# Patient Record
Sex: Female | Born: 1950 | Race: White | Hispanic: No | Smoking: Current every day smoker
Health system: Southern US, Community
[De-identification: ages and names within clinical notes are randomized; demographics above are authoritative.]

## PROBLEM LIST (undated history)

## (undated) DIAGNOSIS — C3490 Malignant neoplasm of unspecified part of unspecified bronchus or lung: Secondary | ICD-10-CM

## (undated) DIAGNOSIS — C801 Malignant (primary) neoplasm, unspecified: Secondary | ICD-10-CM

## (undated) DIAGNOSIS — I10 Essential (primary) hypertension: Secondary | ICD-10-CM

## (undated) HISTORY — PX: BRAIN SURGERY: SHX531

## (undated) HISTORY — PX: OTHER SURGICAL HISTORY: SHX169

## (undated) HISTORY — PX: VEIN SURGERY: SHX48

## (undated) HISTORY — PX: ABDOMINAL HYSTERECTOMY: SHX81

---

## 2016-11-06 ENCOUNTER — Other Ambulatory Visit (HOSPITAL_BASED_OUTPATIENT_CLINIC_OR_DEPARTMENT_OTHER): Payer: Self-pay | Admitting: Radiation Oncology

## 2016-11-06 DIAGNOSIS — G9389 Other specified disorders of brain: Secondary | ICD-10-CM

## 2016-11-06 DIAGNOSIS — C342 Malignant neoplasm of middle lobe, bronchus or lung: Secondary | ICD-10-CM

## 2016-11-11 ENCOUNTER — Ambulatory Visit (HOSPITAL_BASED_OUTPATIENT_CLINIC_OR_DEPARTMENT_OTHER)
Admission: RE | Admit: 2016-11-11 | Discharge: 2016-11-11 | Disposition: A | Discharge status: Home / Self Care | Payer: Medicare PPO | Source: Ambulatory Visit | Attending: Radiation Oncology | Admitting: Radiation Oncology

## 2016-11-11 DIAGNOSIS — G936 Cerebral edema: Secondary | ICD-10-CM | POA: Insufficient documentation

## 2016-11-11 DIAGNOSIS — G9389 Other specified disorders of brain: Secondary | ICD-10-CM

## 2016-11-11 DIAGNOSIS — C342 Malignant neoplasm of middle lobe, bronchus or lung: Secondary | ICD-10-CM | POA: Diagnosis present

## 2016-11-11 DIAGNOSIS — J439 Emphysema, unspecified: Secondary | ICD-10-CM | POA: Insufficient documentation

## 2016-11-11 DIAGNOSIS — G939 Disorder of brain, unspecified: Secondary | ICD-10-CM | POA: Diagnosis present

## 2016-11-11 DIAGNOSIS — G930 Cerebral cysts: Secondary | ICD-10-CM | POA: Insufficient documentation

## 2016-11-11 DIAGNOSIS — R918 Other nonspecific abnormal finding of lung field: Secondary | ICD-10-CM | POA: Diagnosis not present

## 2016-11-11 DIAGNOSIS — Z9889 Other specified postprocedural states: Secondary | ICD-10-CM | POA: Diagnosis not present

## 2016-11-11 DIAGNOSIS — C3491 Malignant neoplasm of unspecified part of right bronchus or lung: Secondary | ICD-10-CM | POA: Insufficient documentation

## 2018-08-26 ENCOUNTER — Other Ambulatory Visit (HOSPITAL_BASED_OUTPATIENT_CLINIC_OR_DEPARTMENT_OTHER): Payer: Self-pay | Admitting: Radiation Oncology

## 2018-08-26 DIAGNOSIS — C342 Malignant neoplasm of middle lobe, bronchus or lung: Secondary | ICD-10-CM

## 2018-08-31 ENCOUNTER — Ambulatory Visit (HOSPITAL_BASED_OUTPATIENT_CLINIC_OR_DEPARTMENT_OTHER): Payer: Medicare PPO

## 2018-09-07 ENCOUNTER — Ambulatory Visit (HOSPITAL_BASED_OUTPATIENT_CLINIC_OR_DEPARTMENT_OTHER)
Admission: RE | Admit: 2018-09-07 | Discharge: 2018-09-07 | Disposition: A | Discharge status: Home / Self Care | Payer: Medicare PPO | Source: Ambulatory Visit | Attending: Radiation Oncology | Admitting: Radiation Oncology

## 2018-09-07 DIAGNOSIS — C7931 Secondary malignant neoplasm of brain: Secondary | ICD-10-CM | POA: Insufficient documentation

## 2018-09-07 DIAGNOSIS — C342 Malignant neoplasm of middle lobe, bronchus or lung: Secondary | ICD-10-CM

## 2018-09-07 MED ORDER — GADOBENATE DIMEGLUMINE 529 MG/ML IV SOLN
10.0000 mL | Freq: Once | INTRAVENOUS | Status: AC | PRN
  Administered 2018-09-07: 10 mL via INTRAVENOUS

## 2018-10-05 ENCOUNTER — Inpatient Hospital Stay (HOSPITAL_COMMUNITY): Payer: Medicare PPO

## 2018-10-05 ENCOUNTER — Other Ambulatory Visit: Payer: Self-pay

## 2018-10-05 ENCOUNTER — Emergency Department (HOSPITAL_COMMUNITY): Payer: Medicare PPO

## 2018-10-05 ENCOUNTER — Encounter (HOSPITAL_BASED_OUTPATIENT_CLINIC_OR_DEPARTMENT_OTHER): Payer: Self-pay | Admitting: *Deleted

## 2018-10-05 ENCOUNTER — Inpatient Hospital Stay (HOSPITAL_BASED_OUTPATIENT_CLINIC_OR_DEPARTMENT_OTHER)
Admission: EM | Admit: 2018-10-05 | Discharge: 2018-10-08 | DRG: 253 | Disposition: A | Discharge status: Home / Self Care | Payer: Medicare PPO | Source: Other Acute Inpatient Hospital | Attending: Internal Medicine | Admitting: Internal Medicine

## 2018-10-05 DIAGNOSIS — I7092 Chronic total occlusion of artery of the extremities: Secondary | ICD-10-CM | POA: Diagnosis present

## 2018-10-05 DIAGNOSIS — C7931 Secondary malignant neoplasm of brain: Secondary | ICD-10-CM | POA: Diagnosis present

## 2018-10-05 DIAGNOSIS — F17210 Nicotine dependence, cigarettes, uncomplicated: Secondary | ICD-10-CM | POA: Diagnosis present

## 2018-10-05 DIAGNOSIS — Z79899 Other long term (current) drug therapy: Secondary | ICD-10-CM

## 2018-10-05 DIAGNOSIS — Z7951 Long term (current) use of inhaled steroids: Secondary | ICD-10-CM | POA: Diagnosis not present

## 2018-10-05 DIAGNOSIS — T82898A Other specified complication of vascular prosthetic devices, implants and grafts, initial encounter: Principal | ICD-10-CM | POA: Diagnosis present

## 2018-10-05 DIAGNOSIS — R402360 Coma scale, best motor response, obeys commands, unspecified time: Secondary | ICD-10-CM | POA: Diagnosis present

## 2018-10-05 DIAGNOSIS — R402140 Coma scale, eyes open, spontaneous, unspecified time: Secondary | ICD-10-CM | POA: Diagnosis present

## 2018-10-05 DIAGNOSIS — R200 Anesthesia of skin: Secondary | ICD-10-CM | POA: Diagnosis present

## 2018-10-05 DIAGNOSIS — I998 Other disorder of circulatory system: Secondary | ICD-10-CM | POA: Diagnosis present

## 2018-10-05 DIAGNOSIS — R402250 Coma scale, best verbal response, oriented, unspecified time: Secondary | ICD-10-CM | POA: Diagnosis present

## 2018-10-05 DIAGNOSIS — I739 Peripheral vascular disease, unspecified: Secondary | ICD-10-CM | POA: Diagnosis not present

## 2018-10-05 DIAGNOSIS — G8918 Other acute postprocedural pain: Secondary | ICD-10-CM

## 2018-10-05 DIAGNOSIS — M79604 Pain in right leg: Secondary | ICD-10-CM | POA: Diagnosis present

## 2018-10-05 DIAGNOSIS — I10 Essential (primary) hypertension: Secondary | ICD-10-CM | POA: Diagnosis present

## 2018-10-05 DIAGNOSIS — J449 Chronic obstructive pulmonary disease, unspecified: Secondary | ICD-10-CM | POA: Diagnosis present

## 2018-10-05 DIAGNOSIS — C3490 Malignant neoplasm of unspecified part of unspecified bronchus or lung: Secondary | ICD-10-CM

## 2018-10-05 DIAGNOSIS — Y832 Surgical operation with anastomosis, bypass or graft as the cause of abnormal reaction of the patient, or of later complication, without mention of misadventure at the time of the procedure: Secondary | ICD-10-CM | POA: Diagnosis present

## 2018-10-05 DIAGNOSIS — T82868A Thrombosis of vascular prosthetic devices, implants and grafts, initial encounter: Secondary | ICD-10-CM | POA: Diagnosis not present

## 2018-10-05 DIAGNOSIS — Z01811 Encounter for preprocedural respiratory examination: Secondary | ICD-10-CM

## 2018-10-05 DIAGNOSIS — Z923 Personal history of irradiation: Secondary | ICD-10-CM | POA: Diagnosis not present

## 2018-10-05 DIAGNOSIS — Z7982 Long term (current) use of aspirin: Secondary | ICD-10-CM

## 2018-10-05 DIAGNOSIS — Z880 Allergy status to penicillin: Secondary | ICD-10-CM

## 2018-10-05 HISTORY — DX: Malignant neoplasm of unspecified part of unspecified bronchus or lung: C34.90

## 2018-10-05 HISTORY — DX: Malignant (primary) neoplasm, unspecified: C80.1

## 2018-10-05 HISTORY — DX: Essential (primary) hypertension: I10

## 2018-10-05 LAB — CBC WITH DIFFERENTIAL/PLATELET
Abs Immature Granulocytes: 0.02 10*3/uL (ref 0.00–0.07)
Basophils Absolute: 0.1 10*3/uL (ref 0.0–0.1)
Basophils Relative: 1 %
Eosinophils Absolute: 0.3 10*3/uL (ref 0.0–0.5)
Eosinophils Relative: 3 %
HCT: 44.5 % (ref 36.0–46.0)
Hemoglobin: 14 g/dL (ref 12.0–15.0)
Immature Granulocytes: 0 %
Lymphocytes Relative: 35 %
Lymphs Abs: 3.4 10*3/uL (ref 0.7–4.0)
MCH: 28.9 pg (ref 26.0–34.0)
MCHC: 31.5 g/dL (ref 30.0–36.0)
MCV: 91.8 fL (ref 80.0–100.0)
Monocytes Absolute: 0.5 10*3/uL (ref 0.1–1.0)
Monocytes Relative: 5 %
Neutro Abs: 5.5 10*3/uL (ref 1.7–7.7)
Neutrophils Relative %: 56 %
PLATELETS: 218 10*3/uL (ref 150–400)
RBC: 4.85 MIL/uL (ref 3.87–5.11)
RDW: 13.7 % (ref 11.5–15.5)
WBC: 9.8 10*3/uL (ref 4.0–10.5)
nRBC: 0 % (ref 0.0–0.2)

## 2018-10-05 LAB — I-STAT CHEM 8, ED
BUN: 14 mg/dL (ref 8–23)
CHLORIDE: 102 mmol/L (ref 98–111)
Calcium, Ion: 1.11 mmol/L — ABNORMAL LOW (ref 1.15–1.40)
Creatinine, Ser: 0.8 mg/dL (ref 0.44–1.00)
Glucose, Bld: 89 mg/dL (ref 70–99)
HCT: 44 % (ref 36.0–46.0)
Hemoglobin: 15 g/dL (ref 12.0–15.0)
Potassium: 3.7 mmol/L (ref 3.5–5.1)
Sodium: 138 mmol/L (ref 135–145)
TCO2: 26 mmol/L (ref 22–32)

## 2018-10-05 LAB — I-STAT CG4 LACTIC ACID, ED: Lactic Acid, Venous: 0.98 mmol/L (ref 0.5–1.9)

## 2018-10-05 MED ORDER — FLUTICASONE FUROATE-VILANTEROL 100-25 MCG/INH IN AEPB
1.0000 | INHALATION_SPRAY | Freq: Every day | RESPIRATORY_TRACT | Status: DC
  Administered 2018-10-07 – 2018-10-08 (×2): 1 via RESPIRATORY_TRACT
  Filled 2018-10-05: qty 28

## 2018-10-05 MED ORDER — LORAZEPAM 1 MG PO TABS
1.0000 mg | ORAL_TABLET | Freq: Every day | ORAL | Status: DC
  Administered 2018-10-06 – 2018-10-07 (×3): 1 mg via ORAL
  Filled 2018-10-05 (×3): qty 1

## 2018-10-05 MED ORDER — MORPHINE SULFATE (PF) 4 MG/ML IV SOLN
4.0000 mg | Freq: Once | INTRAVENOUS | Status: AC
  Administered 2018-10-05: 4 mg via INTRAVENOUS
  Filled 2018-10-05: qty 1

## 2018-10-05 MED ORDER — MORPHINE SULFATE (PF) 2 MG/ML IV SOLN
2.0000 mg | INTRAVENOUS | Status: DC | PRN
  Administered 2018-10-06 – 2018-10-08 (×6): 2 mg via INTRAVENOUS
  Filled 2018-10-05 (×6): qty 1

## 2018-10-05 MED ORDER — ACETAMINOPHEN 325 MG PO TABS: 650.0000 mg | ORAL_TABLET | Freq: Four times a day (QID) | ORAL | Status: DC | PRN

## 2018-10-05 MED ORDER — IOPAMIDOL (ISOVUE-370) INJECTION 76%
100.0000 mL | Freq: Once | INTRAVENOUS | Status: AC | PRN
  Administered 2018-10-05: 100 mL via INTRAVENOUS

## 2018-10-05 MED ORDER — POTASSIUM CHLORIDE IN NACL 20-0.9 MEQ/L-% IV SOLN
INTRAVENOUS | Status: AC
  Administered 2018-10-06: via INTRAVENOUS
  Filled 2018-10-05: qty 1000

## 2018-10-05 MED ORDER — BUPROPION HCL 100 MG PO TABS
100.0000 mg | ORAL_TABLET | Freq: Three times a day (TID) | ORAL | Status: DC
  Administered 2018-10-07 – 2018-10-08 (×3): 100 mg via ORAL
  Filled 2018-10-05 (×7): qty 1

## 2018-10-05 MED ORDER — ONDANSETRON HCL 4 MG/2ML IJ SOLN: 4.0000 mg | Freq: Four times a day (QID) | INTRAMUSCULAR | Status: DC | PRN

## 2018-10-05 MED ORDER — ONDANSETRON HCL 4 MG/2ML IJ SOLN
4.0000 mg | Freq: Once | INTRAMUSCULAR | Status: AC
  Administered 2018-10-05: 4 mg via INTRAVENOUS
  Filled 2018-10-05: qty 2

## 2018-10-05 MED ORDER — ONDANSETRON HCL 4 MG PO TABS: 4.0000 mg | ORAL_TABLET | Freq: Four times a day (QID) | ORAL | Status: DC | PRN

## 2018-10-05 MED ORDER — POLYETHYLENE GLYCOL 3350 17 G PO PACK
17.0000 g | PACK | Freq: Every day | ORAL | Status: DC | PRN
  Administered 2018-10-08: 17 g via ORAL
  Filled 2018-10-05: qty 1

## 2018-10-05 MED ORDER — ACETAMINOPHEN 650 MG RE SUPP: 650.0000 mg | Freq: Four times a day (QID) | RECTAL | Status: DC | PRN

## 2018-10-05 MED ORDER — FLUTICASONE-UMECLIDIN-VILANT 100-62.5-25 MCG/INH IN AEPB: 1.0000 | INHALATION_SPRAY | Freq: Every day | RESPIRATORY_TRACT | Status: DC

## 2018-10-05 MED ORDER — OXYCODONE-ACETAMINOPHEN 5-325 MG PO TABS
2.0000 | ORAL_TABLET | Freq: Once | ORAL | Status: AC
  Administered 2018-10-05: 2 via ORAL
  Filled 2018-10-05: qty 2

## 2018-10-05 MED ORDER — ALBUTEROL SULFATE (2.5 MG/3ML) 0.083% IN NEBU: 2.5000 mg | INHALATION_SOLUTION | Freq: Four times a day (QID) | RESPIRATORY_TRACT | Status: DC | PRN

## 2018-10-05 MED ORDER — UMECLIDINIUM BROMIDE 62.5 MCG/INH IN AEPB
1.0000 | INHALATION_SPRAY | Freq: Every day | RESPIRATORY_TRACT | Status: DC
  Administered 2018-10-07 – 2018-10-08 (×2): 1 via RESPIRATORY_TRACT
  Filled 2018-10-05: qty 7

## 2018-10-05 MED ORDER — VANCOMYCIN HCL IN DEXTROSE 1-5 GM/200ML-% IV SOLN
1000.0000 mg | INTRAVENOUS | Status: AC
  Administered 2018-10-06: 1000 mg via INTRAVENOUS
  Filled 2018-10-05: qty 200

## 2018-10-05 NOTE — H&P (View-Only) (Signed)
Vascular and Vein Specialist of [facility]  Patient name: Shelia Williams MRN: [id number] DOB: 28-Jun-1951 Sex: female   REQUESTING PROVIDER:    ER   REASON FOR CONSULT:    Right leg pain  HISTORY OF PRESENT ILLNESS:   Shelia Williams is a 67 y.o. female, who presented to the emergency department with pain in her right leg that has been going on for several days.  She states that she has numbness in her right toe which is painful and cool.  This is preventing her from sleeping.  She has a history of bilateral femoral-popliteal bypass graft with Gore-Tex in the remote past.  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  Her most recent MRI on 09/07/2018 revealed new recurrences.  She does not have any neurologic symptoms.  She is a smoker.  She intermittently takes aspirin.  She is medically managed for hypertension.  PAST MEDICAL HISTORY    Past Medical History:  Diagnosis Date  . Cancer ([place])   . Hypertension   . Lung cancer ([place])      FAMILY HISTORY   History reviewed. No pertinent family history.  SOCIAL HISTORY:   Social History   Socioeconomic History  . Marital status: Widowed    Spouse name: Not on file  . Number of children: Not on file  . Years of education: Not on file  . Highest education level: Not on file  Occupational History  . Not on file  Social Needs  . Financial resource strain: Not on file  . Food insecurity:    Worry: Not on file    Inability: Not on file  . Transportation needs:    Medical: Not on file    Non-medical: Not on file  Tobacco Use  . Smoking status: Current Every Day Smoker    Types: Cigarettes  . Smokeless tobacco: Never Used  Substance and Sexual Activity  . Alcohol use: Not Currently  . Drug use: Never  . Sexual activity: Not on file  Lifestyle  . Physical activity:    Days per week: Not on file    Minutes per session: Not on file  . Stress: Not on file    Relationships  . Social connections:    Talks on phone: Not on file    Gets together: Not on file    Attends religious service: Not on file    Active member of club or organization: Not on file    Attends meetings of clubs or organizations: Not on file    Relationship status: Not on file  . Intimate partner violence:    Fear of current or ex partner: Not on file    Emotionally abused: Not on file    Physically abused: Not on file    Forced sexual activity: Not on file  Other Topics Concern  . Not on file  Social History Narrative  . Not on file    ALLERGIES:    Allergies  Allergen Reactions  . Penicillins Itching    Has patient had a PCN reaction causing immediate rash, facial/tongue/throat swelling, SOB or lightheadedness with hypotension: No Has patient had a PCN reaction causing severe rash involving mucus membranes or skin necrosis: No Has patient had a PCN reaction that required hospitalization: No Has patient had a PCN reaction occurring within the last 10 years: No If all of the above answers are "NO", then may proceed with Cephalosporin use.     CURRENT MEDICATIONS:  No current facility-administered medications for this encounter.    Current Outpatient Medications  Medication Sig Dispense Refill  . albuterol (PROVENTIL HFA;VENTOLIN HFA) 108 (90 Base) MCG/ACT inhaler Inhale 2 puffs into the lungs every 6 (six) hours as needed for wheezing.    Marland Kitchen albuterol (PROVENTIL) (2.5 MG/3ML) 0.083% nebulizer solution Inhale 2.5 mg into the lungs every 6 (six) hours as needed for wheezing.    Marland Kitchen aspirin 81 MG chewable tablet Chew 81 mg by mouth daily.    Marland Kitchen buPROPion (WELLBUTRIN) 100 MG tablet Take 300 mg by mouth daily.    . hydrochlorothiazide (MICROZIDE) 12.5 MG capsule Take 12.5 mg by mouth daily.    Marland Kitchen ibuprofen (ADVIL,MOTRIN) 200 MG tablet Take 200 mg by mouth every 6 (six) hours as needed for pain.    Marland Kitchen LORazepam (ATIVAN) 1 MG tablet Take 1 mg by mouth at bedtime.  0   . promethazine (PHENERGAN) 12.5 MG tablet Take 12.5 mg by mouth as needed for dizziness or nausea.    . traMADol (ULTRAM) 50 MG tablet Take 50 mg by mouth every 6 (six) hours as needed for pain.    . TRELEGY ELLIPTA 100-62.5-25 MCG/INH AEPB Inhale 1 puff into the lungs daily.      REVIEW OF SYSTEMS:   [X]  denotes positive finding, [ ]  denotes negative finding Cardiac  Comments:  Chest pain or chest pressure:    Shortness of breath upon exertion:    Short of breath when lying flat:    Irregular heart rhythm:        Vascular    Pain in calf, thigh, or hip brought on by ambulation: x   Pain in feet at night that wakes you up from your sleep:  x   Blood clot in your veins:    Leg swelling:         Pulmonary    Oxygen at home:    Productive cough:     Wheezing:         Neurologic    Sudden weakness in arms or legs:     Sudden numbness in arms or legs:     Sudden onset of difficulty speaking or slurred speech:    Temporary loss of vision in one eye:     Problems with dizziness:         Gastrointestinal    Blood in stool:      Vomited blood:         Genitourinary    Burning when urinating:     Blood in urine:        Psychiatric    Major depression:         Hematologic    Bleeding problems:    Problems with blood clotting too easily:        Skin    Rashes or ulcers:        Constitutional    Fever or chills:     PHYSICAL EXAM:   Vitals:   10/05/18 1349 10/05/18 1353 10/05/18 2128  BP:  (!) 151/89 (!) 155/66  Pulse:  81 83  Resp:  18 16  Temp:  98.9 F (37.2 C) 98.7 F (37.1 C)  TempSrc:  Oral Oral  SpO2:  94% 97%  Weight: 59 kg    Height: 5\' 2"  (1.575 m)      GENERAL: The patient is a well-nourished female, in no acute distress. The vital signs are documented above. CARDIAC: There is a regular rate and rhythm.  VASCULAR:  Palpable femoral pulses, nonpalpable pedal pulses PULMONARY: Nonlabored respirations ABDOMEN: Soft and non-tender with normal  pitched bowel sounds.  MUSCULOSKELETAL: There are no major deformities or cyanosis. NEUROLOGIC: Normal motor and sensory function to both feet SKIN: There are no ulcers or rashes noted. PSYCHIATRIC: The patient has a normal affect.  STUDIES:   I have reviewed her CT scan with the following findings: VASCULAR  1. Complete occlusion of the right femoropopliteal bypass graft with reconstitution of small amount of flow in the distal SFA and popliteal artery. 2. Luminal narrowing of the left femoropopliteal bypass graft. The graft remains patent. 3. Patent appearance of the anterior tibial arteries and peroneal arteries bilaterally. Occluded peroneal arteries. 4. Advanced atherosclerotic calcification of the abdominal aorta. ASSESSMENT and PLAN   Occluded right femoral-popliteal bypass graft: The patient cannot tolerate the level of discomfort she is having in her right leg.  I discussed proceeding with thrombectomy of her right femoral-popliteal bypass graft.  I discussed that I would further evaluate her inflow and outflow with angiography.  She potentially may require iliac stenting.  We also discussed the possibility of a redo bypass.  The risks and benefits of the operation were discussed with the patient and her daughter and they wish to proceed.  I would like for her to be admitted by the hospital service especially given that she has active brain mets from her lung cancer.  I am a little concerned about the risk of heparinization during her operation.  She is not a candidate for thrombolysis given her brain mets.  She will be n.p.o. after midnight with plans for surgery in the morning.   [name], MD Vascular and Vein Specialists of [facility] [phone] Pager [phone]

## 2018-10-05 NOTE — ED Notes (Signed)
Pt to CT via stretcher

## 2018-10-05 NOTE — ED Notes (Signed)
Difficult IV x 3

## 2018-10-05 NOTE — Discharge Instructions (Signed)
Go straight to the [facility] emergency department.

## 2018-10-05 NOTE — ED Provider Notes (Signed)
[place] EMERGENCY DEPARTMENT Provider Note   CSN: [id number] Arrival date & time: 10/05/18  1339     History   Chief Complaint Chief Complaint  Patient presents with  . Back Pain    HPI Shelia Williams is a 67 y.o. female with a hx of tobacco abuse, HTN, and lung cancer w/ mets to the brain who is in remission (receives MRI of brain and CT of the chest q3 months- most recent imaging without concern) who presents to the ED for RLE pain x 5 days. Patient reports discomfort started in the R lower leg into the foot and has since seemed to become the entire leg and right lower back. She states pain is fairly constant and the area feels numb and cool to the touch at times, more so at night, she has also noted some weakness. Other than worse at night, she also reports it is worse when she walks sometimes. Otherwise no specific alleviating/aggravating factors. Tried ibuprofen without relief. Patient states that earlier this week she was doing some heavy lifting w moving but cannot recall a specific injury or onset of pain. She has hx of cancer as discussed above. She also has hx of prior bilateral femoropopliteal bypass about 5 years prior, not done locally. Denies leg swelling, saddle anesthesia, incontinence to bowel/bladder, fever, chills, IV drug use, or prior back surgeries.    HPI  Past Medical History:  Diagnosis Date  . Cancer ([place])   . Hypertension   . Lung cancer ([place])     There are no active problems to display for this patient.   Past Surgical History:  Procedure Laterality Date  . ABDOMINAL HYSTERECTOMY    . arm surgery    . BRAIN SURGERY    . VEIN SURGERY       OB History   None      Home Medications    Prior to Admission medications   Not on File    Family History No family history on file.  Social History Social History   Tobacco Use  . Smoking status: Current Every Day Smoker    Types: Cigarettes  . Smokeless tobacco: Never Used    Substance Use Topics  . Alcohol use: Not Currently  . Drug use: Never     Allergies   Patient has no known allergies.   Review of Systems Review of Systems  Constitutional: Negative for chills and fever.  Respiratory: Negative for shortness of breath.   Cardiovascular: Negative for chest pain and leg swelling.  Gastrointestinal: Negative for abdominal pain, nausea and vomiting.  Musculoskeletal: Positive for back pain and myalgias (RLE).  Neurological: Positive for weakness and numbness. Negative for syncope and headaches.  All other systems reviewed and are negative.    Physical Exam Updated Vital Signs BP (!) 151/89 (BP Location: Left Arm)   Pulse 81   Temp 98.9 F (37.2 C) (Oral)   Resp 18   Ht 5\' 2"  (1.575 m)   Wt 59 kg   SpO2 94%   BMI 23.78 kg/m   Physical Exam  Constitutional: She appears well-developed and well-nourished.  Non-toxic appearance. No distress.  HENT:  Head: Normocephalic and atraumatic.  Eyes: Conjunctivae are normal. Right eye exhibits no discharge. Left eye exhibits no discharge.  Neck: Neck supple.  Cardiovascular: Normal rate and regular rhythm.  Palpable symmetric 1+ femoral pulses. Unable to palpate or doppler distal pulses in the bilateral lower extremities.  Extremities are warm to touch without white/pale  appearance. Good capillary refill.   Pulmonary/Chest: Effort normal and breath sounds normal. No respiratory distress. She has no wheezes. She has no rhonchi. She has no rales.  Respiration even and unlabored  Abdominal: Soft. She exhibits no distension. There is no tenderness.  Musculoskeletal:  No obvious deformity, appreciable swelling, edema, erythema, ecchymosis, rashes, or increased warmth. Back: Patient is tender to palpation to the midline and right-sided paraspinal muscles of the lumbar spine.  There is no point/focal vertebral tenderness or palpable step-off.  Lower extremities: Patient has full active range of motion to  bilateral hips, knees, ankles, and all digits.  Lower extremities are nontender to palpation.  Neurological: She is alert.  Clear speech.  Sensation grossly intact bilateral lower extremities and feels symmetric per the patient.  She is able to identify/discriminate between sharp/dull touch to bilateral lower extremities.  She seems to have some decreased strength with R ankle plantar flexion.  Otherwise strength seems symmetric.  Patellar DTRs are 2+ and symmetric.  Patient is ambulatory.  Skin: Skin is warm and dry. No rash noted.  Psychiatric: She has a normal mood and affect. Her behavior is normal.  Nursing note and vitals reviewed.    ED Treatments / Results  Labs (all labs ordered are listed, but only abnormal results are displayed) Labs Reviewed - No data to display  EKG None  Radiology No results found.  Procedures Procedures (including critical care time)  Medications Ordered in ED Medications - No data to display   Initial Impression / Assessment and Plan / ED Course  I have reviewed the triage vital signs and the nursing notes.  Pertinent labs & imaging results that were available during my care of the patient were reviewed by me and considered in my medical decision making (see chart for details).   Patient with hx of prior fem-pop bypass bilaterally, lung cancer without active tx, and tobacco abuse presents to the ED with RLE pain, worse @ night with numbness/coolness sensation per her report. On initial exam lower extremity exam here, extremities appear symmetric, not necesarily pale/white, not cool to the touch, with good cap refill. Non tender. Sensation to sharp/dull touch is intact. Some decreased strength with RLE plantar flexion. Palpable femoral pulses. Unable to palpate or doppler DP/PT pulses. Discussed with supervising physician Dr. Johnney Killian who has evaluated patient- recommends discussion with vascular surgery with transfer to [facility] for ABIs and likely CTAs of  lower extremities.   15:51: CONSULT: Discussed case with vascular surgeon, Dr. Trula Slade- recommends ER to ER transfer to [facility] to receive work up w/ further consultation as needed.  Dr Johnney Killian discussed with ER physician Dr. Vanita Panda who accepts patient for transfer.   Discussed EMS/carelink transfer, patient and her daughter requesting POV transfer as opposed to EMS/carelink, her daughter at bedside will take her directly to the [facility] ER. Provided opportunity for questions they confirmed understanding and are in agreement with plan.   Final Clinical Impressions(s) / ED Diagnoses   Final diagnoses:  Right leg pain    ED Discharge Orders    None       Amaryllis Dyke, PA-C 10/06/18 0013    Charlesetta Shanks, MD 10/07/18 [phone]

## 2018-10-05 NOTE — ED Triage Notes (Signed)
Pt reports she was cleaning out her mother's house this week. Began having right side low back pain and also pain in her right shin. States right foot is numb. Denies known injury. Voices concern for blood clot or back injury

## 2018-10-05 NOTE — Consult Note (Signed)
Vascular and Vein Specialist of [facility]  Patient name: Shelia Williams MRN: [id number] DOB: [birth year] Sex: female   REQUESTING PROVIDER:    ER   REASON FOR CONSULT:    Right leg pain  HISTORY OF PRESENT ILLNESS:   Shelia Williams is a 67 y.o. female, who presented to the emergency department with pain in her right leg that has been going on for several days.  She states that she has numbness in her right toe which is painful and cool.  This is preventing her from sleeping.  She has a history of bilateral femoral-popliteal bypass graft with Gore-Tex in the remote past.  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  Her most recent MRI on 09/07/2018 revealed new recurrences.  She does not have any neurologic symptoms.  She is a smoker.  She intermittently takes aspirin.  She is medically managed for hypertension.  PAST MEDICAL HISTORY    Past Medical History:  Diagnosis Date  . Cancer ([place])   . Hypertension   . Lung cancer ([place])      FAMILY HISTORY   History reviewed. No pertinent family history.  SOCIAL HISTORY:   Social History   Socioeconomic History  . Marital status: Widowed    Spouse name: Not on file  . Number of children: Not on file  . Years of education: Not on file  . Highest education level: Not on file  Occupational History  . Not on file  Social Needs  . Financial resource strain: Not on file  . Food insecurity:    Worry: Not on file    Inability: Not on file  . Transportation needs:    Medical: Not on file    Non-medical: Not on file  Tobacco Use  . Smoking status: Current Every Day Smoker    Types: Cigarettes  . Smokeless tobacco: Never Used  Substance and Sexual Activity  . Alcohol use: Not Currently  . Drug use: Never  . Sexual activity: Not on file  Lifestyle  . Physical activity:    Days per week: Not on file    Minutes per session: Not on file  . Stress: Not on file    Relationships  . Social connections:    Talks on phone: Not on file    Gets together: Not on file    Attends religious service: Not on file    Active member of club or organization: Not on file    Attends meetings of clubs or organizations: Not on file    Relationship status: Not on file  . Intimate partner violence:    Fear of current or ex partner: Not on file    Emotionally abused: Not on file    Physically abused: Not on file    Forced sexual activity: Not on file  Other Topics Concern  . Not on file  Social History Narrative  . Not on file    ALLERGIES:    Allergies  Allergen Reactions  . Penicillins Itching    Has patient had a PCN reaction causing immediate rash, facial/tongue/throat swelling, SOB or lightheadedness with hypotension: No Has patient had a PCN reaction causing severe rash involving mucus membranes or skin necrosis: No Has patient had a PCN reaction that required hospitalization: No Has patient had a PCN reaction occurring within the last 10 years: No If all of the above answers are "NO", then may proceed with Cephalosporin use.     CURRENT MEDICATIONS:  No current facility-administered medications for this encounter.    Current Outpatient Medications  Medication Sig Dispense Refill  . albuterol (PROVENTIL HFA;VENTOLIN HFA) 108 (90 Base) MCG/ACT inhaler Inhale 2 puffs into the lungs every 6 (six) hours as needed for wheezing.    Marland Kitchen albuterol (PROVENTIL) (2.5 MG/3ML) 0.083% nebulizer solution Inhale 2.5 mg into the lungs every 6 (six) hours as needed for wheezing.    Marland Kitchen aspirin 81 MG chewable tablet Chew 81 mg by mouth daily.    Marland Kitchen buPROPion (WELLBUTRIN) 100 MG tablet Take 300 mg by mouth daily.    . hydrochlorothiazide (MICROZIDE) 12.5 MG capsule Take 12.5 mg by mouth daily.    Marland Kitchen ibuprofen (ADVIL,MOTRIN) 200 MG tablet Take 200 mg by mouth every 6 (six) hours as needed for pain.    Marland Kitchen LORazepam (ATIVAN) 1 MG tablet Take 1 mg by mouth at bedtime.  0   . promethazine (PHENERGAN) 12.5 MG tablet Take 12.5 mg by mouth as needed for dizziness or nausea.    . traMADol (ULTRAM) 50 MG tablet Take 50 mg by mouth every 6 (six) hours as needed for pain.    . TRELEGY ELLIPTA 100-62.5-25 MCG/INH AEPB Inhale 1 puff into the lungs daily.      REVIEW OF SYSTEMS:   [X]  denotes positive finding, [ ]  denotes negative finding Cardiac  Comments:  Chest pain or chest pressure:    Shortness of breath upon exertion:    Short of breath when lying flat:    Irregular heart rhythm:        Vascular    Pain in calf, thigh, or hip brought on by ambulation: x   Pain in feet at night that wakes you up from your sleep:  x   Blood clot in your veins:    Leg swelling:         Pulmonary    Oxygen at home:    Productive cough:     Wheezing:         Neurologic    Sudden weakness in arms or legs:     Sudden numbness in arms or legs:     Sudden onset of difficulty speaking or slurred speech:    Temporary loss of vision in one eye:     Problems with dizziness:         Gastrointestinal    Blood in stool:      Vomited blood:         Genitourinary    Burning when urinating:     Blood in urine:        Psychiatric    Major depression:         Hematologic    Bleeding problems:    Problems with blood clotting too easily:        Skin    Rashes or ulcers:        Constitutional    Fever or chills:     PHYSICAL EXAM:   Vitals:   10/05/18 1349 10/05/18 1353 10/05/18 2128  BP:  (!) 151/89 (!) 155/66  Pulse:  81 83  Resp:  18 16  Temp:  98.9 F (37.2 C) 98.7 F (37.1 C)  TempSrc:  Oral Oral  SpO2:  94% 97%  Weight: 59 kg    Height: 5\' 2"  (1.575 m)      GENERAL: The patient is a well-nourished female, in no acute distress. The vital signs are documented above. CARDIAC: There is a regular rate and rhythm.  VASCULAR:  Palpable femoral pulses, nonpalpable pedal pulses PULMONARY: Nonlabored respirations ABDOMEN: Soft and non-tender with normal  pitched bowel sounds.  MUSCULOSKELETAL: There are no major deformities or cyanosis. NEUROLOGIC: Normal motor and sensory function to both feet SKIN: There are no ulcers or rashes noted. PSYCHIATRIC: The patient has a normal affect.  STUDIES:   I have reviewed her CT scan with the following findings: VASCULAR  1. Complete occlusion of the right femoropopliteal bypass graft with reconstitution of small amount of flow in the distal SFA and popliteal artery. 2. Luminal narrowing of the left femoropopliteal bypass graft. The graft remains patent. 3. Patent appearance of the anterior tibial arteries and peroneal arteries bilaterally. Occluded peroneal arteries. 4. Advanced atherosclerotic calcification of the abdominal aorta. ASSESSMENT and PLAN   Occluded right femoral-popliteal bypass graft: The patient cannot tolerate the level of discomfort she is having in her right leg.  I discussed proceeding with thrombectomy of her right femoral-popliteal bypass graft.  I discussed that I would further evaluate her inflow and outflow with angiography.  She potentially may require iliac stenting.  We also discussed the possibility of a redo bypass.  The risks and benefits of the operation were discussed with the patient and her daughter and they wish to proceed.  I would like for her to be admitted by the hospital service especially given that she has active brain mets from her lung cancer.  I am a little concerned about the risk of heparinization during her operation.  She is not a candidate for thrombolysis given her brain mets.  She will be n.p.o. after midnight with plans for surgery in the morning.   [name], MD [facility] and Vein Specialists of [facility] [phone] Pager [phone]

## 2018-10-05 NOTE — ED Provider Notes (Signed)
Medical screening examination/treatment/procedure(s) were conducted as a shared visit with non-physician practitioner(s) and myself.  I personally evaluated the patient during the encounter.  None Patient presents with right leg numbness.  She been having lower back pain.  At night the leg is going numb and cold to the touch.  Patient has history of femoropopliteal bypasses.  Patient is alert and appropriate.  Heart is regular.  No respiratory distress.  Patient does not have peripheral edema.  She has 2 incisions from prior arterial bypass surgeries.  I cannot palpate pulses in either foot.  Patient does have a 1+ femoral pulse bilaterally.  Bilateral feet are warm to the touch at this time.  There are no areas of skin ulcerations or wounds.  PA-C Petrucelli has consulted Dr. Trula Slade.  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   Charlesetta Shanks, MD 10/05/18 [phone]

## 2018-10-05 NOTE — ED Provider Notes (Signed)
[facility] Provider Note   CSN: [id number] Arrival date & time: 10/05/18  1339     History   Chief Complaint Chief Complaint  Patient presents with  . Back /FROM [facility]    HPI Shelia Williams is a 67 y.o. female.  The history is provided by the patient and a relative. No language interpreter was used.  Leg Pain   This is a new problem. The current episode started more than 2 days ago. The problem occurs constantly. The problem has been gradually worsening. The pain is present in the right lower leg, right upper leg and right knee. The quality of the pain is described as sharp. The pain is severe. Pertinent negatives include no numbness, full range of motion, no stiffness, no tingling and no itching. She has tried rest for the symptoms.    Past Medical History:  Diagnosis Date  . Cancer ([place])   . Hypertension   . Lung cancer ([place])     There are no active problems to display for this patient.   Past Surgical History:  Procedure Laterality Date  . ABDOMINAL HYSTERECTOMY    . arm surgery    . BRAIN SURGERY    . VEIN SURGERY       OB History   None    Home Medications    Prior to Admission medications   Medication Sig Start Date End Date Taking? Authorizing Provider  albuterol (PROVENTIL HFA;VENTOLIN HFA) 108 (90 Base) MCG/ACT inhaler Inhale 2 puffs into the lungs every 6 (six) hours as needed for wheezing. 10/06/14  Yes [provider]  albuterol (PROVENTIL) (2.5 MG/3ML) 0.083% nebulizer solution Inhale 2.5 mg into the lungs every 6 (six) hours as needed for wheezing. 01/06/16  Yes [provider]  aspirin 81 MG chewable tablet Chew 81 mg by mouth daily.   Yes [provider]  buPROPion (WELLBUTRIN) 100 MG tablet Take 300 mg by mouth daily. 09/20/18  Yes [provider]  hydrochlorothiazide (MICROZIDE) 12.5 MG capsule Take 12.5 mg by mouth daily. 09/20/18  Yes [provider]  ibuprofen  (ADVIL,MOTRIN) 200 MG tablet Take 200 mg by mouth every 6 (six) hours as needed for pain.   Yes [provider]  LORazepam (ATIVAN) 1 MG tablet Take 1 mg by mouth at bedtime. 09/06/18  Yes [provider]  promethazine (PHENERGAN) 12.5 MG tablet Take 12.5 mg by mouth as needed for dizziness or nausea.   Yes [provider]  traMADol (ULTRAM) 50 MG tablet Take 50 mg by mouth every 6 (six) hours as needed for pain.   Yes [provider]  TRELEGY ELLIPTA 100-62.5-25 MCG/INH AEPB Inhale 1 puff into the lungs daily. 07/05/18  Yes [provider]    Family History History reviewed. No pertinent family history.  Social History Social History   Tobacco Use  . Smoking status: Current Every Day Smoker    Types: Cigarettes  . Smokeless tobacco: Never Used  Substance Use Topics  . Alcohol use: Not Currently  . Drug use: Never     Allergies   Penicillins   Review of Systems Review of Systems  Constitutional: Negative for chills and fever.  HENT: Negative for ear pain and sore throat.   Eyes: Negative for pain and visual disturbance.  Respiratory: Negative for cough and shortness of breath.   Cardiovascular: Negative for chest pain and palpitations.  Gastrointestinal: Negative for abdominal pain, diarrhea, nausea and vomiting.  Genitourinary: Negative for dysuria  and hematuria.  Musculoskeletal: Negative for arthralgias, back pain, gait problem and stiffness.  Skin: Positive for color change and pallor. Negative for itching and rash.  Neurological: Negative for tingling, seizures, syncope and numbness.  All other systems reviewed and are negative.  Physical Exam Updated Vital Signs BP (!) 155/66   Pulse 83   Temp 98.7 F (37.1 C) (Oral)   Resp 16   Ht 5\' 2"  (1.575 m)   Wt 59 kg   SpO2 97%   BMI 23.78 kg/m   Physical Exam  Constitutional: She is oriented to person, place, and time. She appears well-developed and well-nourished. No  distress.  HENT:  Head: Normocephalic and atraumatic.  Eyes: Conjunctivae are normal.  Neck: Neck supple.  Cardiovascular: Normal rate and regular rhythm.  No murmur heard. Pulmonary/Chest: Effort normal and breath sounds normal. No respiratory distress.  Abdominal: Soft. There is no tenderness.  Musculoskeletal: She exhibits no edema.       Right ankle: Tenderness.  No palpable or doppler PT/DP/popliteal on R side. Dopplerable signal on L PT/popliteal on L side.  L foot cooler to touch than R foot.   Neurological: She is alert and oriented to person, place, and time.  Skin: Skin is warm and dry.  Psychiatric: She has a normal mood and affect.  Nursing note and vitals reviewed.    ED Treatments / Results  Labs (all labs ordered are listed, but only abnormal results are displayed) Labs Reviewed  I-STAT CHEM 8, ED - Abnormal; Notable for the following components:      Result Value   Calcium, Ion 1.11 (*)    All other components within normal limits  CBC WITH DIFFERENTIAL/PLATELET  I-STAT CG4 LACTIC ACID, ED    EKG None  Radiology No results found.  Procedures Procedures (including critical care time)  Medications Ordered in ED Medications  morphine 4 MG/ML injection 4 mg (4 mg Intravenous Given 10/05/18 2127)  ondansetron (ZOFRAN) injection 4 mg (4 mg Intravenous Given 10/05/18 2125)  oxyCODONE-acetaminophen (PERCOCET/ROXICET) 5-325 MG per tablet 2 tablet (2 tablets Oral Given 10/05/18 1950)  iopamidol (ISOVUE-370) 76 % injection 100 mL (100 mLs Intravenous Contrast Given 10/05/18 2049)     Initial Impression / Assessment and Plan / ED Course  I have reviewed the triage vital signs and the nursing notes.  Pertinent labs & imaging results that were available during my care of the patient were reviewed by me and considered in my medical decision making (see chart for details).    Patient is a 67 year old female with past medical history significant for bilateral  femoropopliteal bypass in 2014, not on anticoagulation at this time, does not take an aspirin regularly presenting for right lower extremity pain that has gotten progressively worse with associated numbness for the past 4 days.  Physical exam shows no palpable or dopplerable pulses in her right lower extremity as opposed to left dopplerable PT and popliteal on the left side.  Vascular surgery was consulted and came to see patient at the bedside. CTA with runoff shows complete occlusion of right lower extremity bypass.  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 Vascular surgery would like guidance on if patient can be given heparin intraoperatively tomorrow during her thrombectomy. Patient will be mated to the hospitalist for further evaluation and management.  Patient will be placed n.p.o. at midnight.  Patient is received IV and p.o. pain  medication for right low extremity pain.  Final Clinical Impressions(s) / ED Diagnoses   Final diagnoses:  Right leg pain    ED Discharge Orders    None       Erskine Squibb, MD 10/05/18 2149    Blanchie Dessert, MD 10/05/18 2355

## 2018-10-05 NOTE — ED Provider Notes (Signed)
I saw and evaluated the patient, reviewed the resident's note and I agree with the findings and plan.  EKG: None  Patient presenting today as a transfer from [place] with ongoing worsening leg pain in the setting of having a femoropopliteal bypass.  Patient's got a palpable right femoral pulse but no dopplerable pulse in the popliteal or DP area.  The foot is slightly cooler than the left foot and slightly lighter in color.  Patient is complaining of excruciating pain and was here for further imaging.  Spoke with Dr. Trula Slade vascular surgery who recommended a CT with runoff.  CT shows that patient has occlusion of her graft.  Dr. Trula Slade recommends hospitalist admission to manage her other ongoing medical problems as well as evaluating if the patient can get heparin while in surgery tomorrow.  Patient continued to have pain and attempted pain control   Blanchie Dessert, MD 10/05/18 2204

## 2018-10-05 NOTE — H&P (Addendum)
History and Physical    Shelia Williams HOZ:[id number] DOB: 04/17/1951 DOA: 10/05/2018  PCP: System, Pcp Not In   Patient coming from: Home  I have personally briefly reviewed patient's old medical records in [place]  Chief Complaint: Right foot pain  HPI: Shelia Williams is a 67 y.o. female with medical history significant for PAD, hypertension, lung cancer with mets to brain, who presented to the Ed with c/o of right leg pain progressively worsened in severity and extended up her thigh to her right lower back and down to her foot, over the past 5 days.  No swelling no redness.  2014-patient reports she has had surgeries to both legs for arterial occlusions, [facility].  Patient receives her care in [facility], but after her mom passed away in 19-Aug-2023, she decided to stay here with her daughter to [place].  ED Course: Stable vitals, Unremarkable CBC, BMP, Normal lactic acid- 0.98.  CT angio AO + BIFEM -complete occlusion of the right femoropopliteal bypass graft with reconstitution of small amount of flow in the distal SFA and popliteal artery.  Vascular surgery- Dr. Trula Slade, consulted in the ED recommended hospitalist admission, also questioned if IV heparin could be given Intra-Op tomorrow.  Review of Systems: As per HPI all other systems reviewed and negative  Past Medical History:  Diagnosis Date  . Cancer ([place])   . Hypertension   . Lung cancer [facility])     Past Surgical History:  Procedure Laterality Date  . ABDOMINAL HYSTERECTOMY    . arm surgery    . BRAIN SURGERY    . VEIN SURGERY       reports that she has been smoking cigarettes. She has never used smokeless tobacco. She reports that she drank alcohol. She reports that she does not use drugs.  Allergies  Allergen Reactions  . Penicillins Itching    Has patient had a PCN reaction causing immediate rash, facial/tongue/throat swelling, SOB or lightheadedness with hypotension: No Has  patient had a PCN reaction causing severe rash involving mucus membranes or skin necrosis: No Has patient had a PCN reaction that required hospitalization: No Has patient had a PCN reaction occurring within the last 10 years: No If all of the above answers are "NO", then may proceed with Cephalosporin use.     History reviewed. No pertinent family history.  Prior to Admission medications   Medication Sig Start Date End Date Taking? Authorizing Provider  albuterol (PROVENTIL HFA;VENTOLIN HFA) 108 (90 Base) MCG/ACT inhaler Inhale 2 puffs into the lungs every 6 (six) hours as needed for wheezing. 10/06/14  Yes [provider]  albuterol (PROVENTIL) (2.5 MG/3ML) 0.083% nebulizer solution Inhale 2.5 mg into the lungs every 6 (six) hours as needed for wheezing. 01/06/16  Yes [provider]  aspirin 81 MG chewable tablet Chew 81 mg by mouth daily.   Yes [provider]  buPROPion (WELLBUTRIN) 100 MG tablet Take 300 mg by mouth daily. 09/20/18  Yes [provider]  hydrochlorothiazide (MICROZIDE) 12.5 MG capsule Take 12.5 mg by mouth daily. 09/20/18  Yes [provider]  ibuprofen (ADVIL,MOTRIN) 200 MG tablet Take 200 mg by mouth every 6 (six) hours as needed for pain.   Yes [provider]  LORazepam (ATIVAN) 1 MG tablet Take 1 mg by mouth at bedtime. 09/06/18  Yes [provider]  promethazine (PHENERGAN) 12.5 MG tablet Take 12.5 mg by mouth as needed for dizziness or nausea.   Yes [provider]  traMADol (ULTRAM) 50 MG tablet Take 50 mg by mouth every 6 (six) hours as needed for pain.   Yes [provider]  TRELEGY ELLIPTA 100-62.5-25 MCG/INH AEPB Inhale 1 puff into the lungs daily. 07/05/18  Yes [provider]    Physical Exam: Vitals:   10/05/18 1349 10/05/18 1353 10/05/18 2128  BP:  (!) 151/89 (!) 155/66  Pulse:  81 83  Resp:  18 16  Temp:  98.9 F (37.2 C) 98.7 F (37.1 C)  TempSrc:  Oral Oral    SpO2:  94% 97%  Weight: 59 kg    Height: 5\' 2"  (1.575 m)      Constitutional: NAD, calm, comfortable Vitals:   10/05/18 1349 10/05/18 1353 10/05/18 2128  BP:  (!) 151/89 (!) 155/66  Pulse:  81 83  Resp:  18 16  Temp:  98.9 F (37.2 C) 98.7 F (37.1 C)  TempSrc:  Oral Oral  SpO2:  94% 97%  Weight: 59 kg    Height: 5\' 2"  (1.575 m)     Eyes: PERRL, lids and conjunctivae normal ENMT: Mucous membranes are moist. Posterior pharynx clear of any exudate or lesions. Neck: normal, supple, no masses, no thyromegaly Respiratory: clear to auscultation bilaterally, no wheezing, no crackles. Normal respiratory effort. No accessory muscle use.  Cardiovascular: Regular rate and rhythm, no murmurs / rubs / gallops. No extremity edema.  DP, PT pulses not appreciated.   Abdomen: no tenderness, no masses palpated. No hepatosplenomegaly. Bowel sounds positive.  Musculoskeletal: no clubbing / cyanosis. No joint deformity upper and lower extremities. Good ROM, no contractures. Normal muscle tone.  Both lower extremities warm, no difference in degree of warmth or coolness. Skin: no rashes, lesions, ulcers. No induration Neurologic: CN 2-12 grossly intact. Sensation intact, DTR normal. Strength 5/5 in all 4.  Psychiatric: Normal judgment and insight. Alert and oriented x 3. Normal mood.   Labs on Admission: I have personally reviewed following labs and imaging studies  CBC: Recent Labs  Lab 10/05/18 1835 10/05/18 1847  WBC 9.8  --   NEUTROABS 5.5  --   HGB 14.0 15.0  HCT 44.5 44.0  MCV 91.8  --   PLT 218  --    Basic Metabolic Panel: Recent Labs  Lab 10/05/18 1847  NA 138  K 3.7  CL 102  GLUCOSE 89  BUN 14  CREATININE 0.80    Radiological Exams on Admission: No results found.   EKG: None  Assessment/Plan Principal Problem:   Right leg pain Active Problems:   COPD (chronic obstructive pulmonary disease) (HCC)   Lung cancer metastatic to brain (HCC)   PVD (peripheral vascular  disease) (H[facility])  Right lower extremity pain- CTA with runoff shows complete occlusion of right lower extremity bypass.  Normal i-STAT lactic acid.  Vascular surgery consulted in ED-questioned if patient can be given heparin intraoperatively to monitoring her thrombectomy. -Oncology/ Rad Oncology consult in A.m- help ascertain if okay to give heparin Intra-Op, with history of brain mets. - NPO midnight - EKG, Chest Xray as part of Pre-Op workup. - N/s 100cc/hr x 12 hrs - Morphine PRN for pain  Lung ( adenocarcinoma )cancer with metastasis to brain- s/p  resection of brain mets 2017, radiation and chemotherapy.  Patient currently on active surveillance with MRI and CT chest every 6 months.  Follows with oncologist -[place], [place]. Last MRI brain 05/2018- REDEMONSTRATION OF MULTIFOCAL INTRACRANIAL METASTASES. THE 2 CEREBRAL LESIONS ARE NOT APPRECIABLY CHANGED IN SIZE, WITH INTERVAL INCREASE  IN SIZE OF LEFT CEREBELLAR LESION WITH MILD INCREASE IN EDEMA WITHOUT SIGNIFICANT MASS EFFECT. NO NEW ENHANCING LESIONS ARE DEMONSTRATED, NOR ACUTE INFARCT. Last Ct chest - 05/2018- Persistent, although less well-defined mass right middle lobe.  COPD-Stable. -Continue home inhalers  HTN-Systolic 287G - Hold HCTZ for now, hydrate   DVT prophylaxis: Scds Code Status: Full Family Communication: Daughter at bedside Disposition Plan: Per rounding team Consults called: Oncology/radiation Oncology in A.m Admission status: Inpt, med- surg   Bethena Roys MD [facility] Pager [phone] From 6PM-2AM.  Otherwise please contact night-coverage [email or web address] Password [facility]  10/05/2018, 10:25 PM

## 2018-10-06 ENCOUNTER — Inpatient Hospital Stay (HOSPITAL_COMMUNITY): Payer: Medicare PPO | Admitting: Anesthesiology

## 2018-10-06 ENCOUNTER — Encounter (HOSPITAL_COMMUNITY)
Admission: EM | Disposition: A | Discharge status: Home / Self Care | Payer: Self-pay | Source: Other Acute Inpatient Hospital | Attending: Internal Medicine

## 2018-10-06 HISTORY — PX: THROMBECTOMY FEMORAL ARTERY: SHX6406

## 2018-10-06 HISTORY — PX: INTRAOPERATIVE ARTERIOGRAM: SHX5157

## 2018-10-06 HISTORY — PX: INSERTION OF ILIAC STENT: SHX6256

## 2018-10-06 HISTORY — PX: PATCH ANGIOPLASTY: SHX6230

## 2018-10-06 LAB — HIV ANTIBODY (ROUTINE TESTING W REFLEX): HIV Screen 4th Generation wRfx: NONREACTIVE

## 2018-10-06 LAB — BASIC METABOLIC PANEL
Anion gap: 11 (ref 5–15)
BUN: 8 mg/dL (ref 8–23)
CO2: 27 mmol/L (ref 22–32)
Calcium: 8.9 mg/dL (ref 8.9–10.3)
Chloride: 101 mmol/L (ref 98–111)
Creatinine, Ser: 0.85 mg/dL (ref 0.44–1.00)
GFR calc Af Amer: >60 mL/min (ref >60)
GFR calc non Af Amer: >60 mL/min (ref >60)
Glucose, Bld: 109 mg/dL — ABNORMAL HIGH (ref 70–99)
Potassium: 4 mmol/L (ref 3.5–5.1)
Sodium: 139 mmol/L (ref 135–145)

## 2018-10-06 LAB — CBC
HEMATOCRIT: 43.7 % (ref 36.0–46.0)
Hemoglobin: 13.4 g/dL (ref 12.0–15.0)
MCH: 28.2 pg (ref 26.0–34.0)
MCHC: 30.7 g/dL (ref 30.0–36.0)
MCV: 91.8 fL (ref 80.0–100.0)
Platelets: 217 10*3/uL (ref 150–400)
RBC: 4.76 MIL/uL (ref 3.87–5.11)
RDW: 13.7 % (ref 11.5–15.5)
WBC: 7.5 10*3/uL (ref 4.0–10.5)
nRBC: 0 % (ref 0.0–0.2)

## 2018-10-06 LAB — PROTIME-INR
INR: 0.91
Prothrombin Time: 12.2 seconds (ref 11.4–15.2)

## 2018-10-06 LAB — SURGICAL PCR SCREEN
MRSA, PCR: NEGATIVE
Staphylococcus aureus: NEGATIVE

## 2018-10-06 SURGERY — THROMBECTOMY, ARTERY, FEMORAL
Anesthesia: General | Laterality: Right

## 2018-10-06 MED ORDER — DOCUSATE SODIUM 100 MG PO CAPS
100.0000 mg | ORAL_CAPSULE | Freq: Every day | ORAL | Status: DC
  Administered 2018-10-07 – 2018-10-08 (×2): 100 mg via ORAL
  Filled 2018-10-06 (×2): qty 1

## 2018-10-06 MED ORDER — MEPERIDINE HCL 50 MG/ML IJ SOLN: 6.2500 mg | INTRAMUSCULAR | Status: DC | PRN

## 2018-10-06 MED ORDER — ASPIRIN 81 MG PO CHEW
81.0000 mg | CHEWABLE_TABLET | Freq: Every day | ORAL | Status: DC
  Administered 2018-10-06 – 2018-10-08 (×3): 81 mg via ORAL
  Filled 2018-10-06 (×3): qty 1

## 2018-10-06 MED ORDER — MAGNESIUM SULFATE 2 GM/50ML IV SOLN: 2.0000 g | Freq: Every day | INTRAVENOUS | Status: DC | PRN

## 2018-10-06 MED ORDER — HYDRALAZINE HCL 20 MG/ML IJ SOLN: 5.0000 mg | INTRAMUSCULAR | Status: DC | PRN

## 2018-10-06 MED ORDER — DEXAMETHASONE SODIUM PHOSPHATE 10 MG/ML IJ SOLN
INTRAMUSCULAR | Status: AC
  Filled 2018-10-06: qty 1

## 2018-10-06 MED ORDER — ONDANSETRON HCL 4 MG/2ML IJ SOLN: 4.0000 mg | Freq: Four times a day (QID) | INTRAMUSCULAR | Status: DC | PRN

## 2018-10-06 MED ORDER — PANTOPRAZOLE SODIUM 40 MG PO TBEC
40.0000 mg | DELAYED_RELEASE_TABLET | Freq: Every day | ORAL | Status: DC
  Administered 2018-10-07 – 2018-10-08 (×2): 40 mg via ORAL
  Filled 2018-10-06 (×2): qty 1

## 2018-10-06 MED ORDER — SODIUM CHLORIDE (PF) 0.9 % IJ SOLN
INTRAVENOUS | Status: DC | PRN
  Administered 2018-10-06: 100 mL via INTRAMUSCULAR

## 2018-10-06 MED ORDER — PROTAMINE SULFATE 10 MG/ML IV SOLN
INTRAVENOUS | Status: DC | PRN
  Administered 2018-10-06: 50 mg via INTRAVENOUS

## 2018-10-06 MED ORDER — ACETAMINOPHEN 325 MG RE SUPP: 325.0000 mg | RECTAL | Status: DC | PRN

## 2018-10-06 MED ORDER — ALBUTEROL SULFATE HFA 108 (90 BASE) MCG/ACT IN AERS
INHALATION_SPRAY | RESPIRATORY_TRACT | Status: DC | PRN
  Administered 2018-10-06: 3 via RESPIRATORY_TRACT

## 2018-10-06 MED ORDER — LACTATED RINGERS IV SOLN
INTRAVENOUS | Status: DC | PRN
  Administered 2018-10-06 (×2): via INTRAVENOUS

## 2018-10-06 MED ORDER — OXYCODONE-ACETAMINOPHEN 5-325 MG PO TABS
1.0000 | ORAL_TABLET | Freq: Four times a day (QID) | ORAL | Status: DC | PRN
  Administered 2018-10-06 – 2018-10-08 (×7): 1 via ORAL
  Filled 2018-10-06 (×8): qty 1

## 2018-10-06 MED ORDER — PHENOL 1.4 % MT LIQD: 1.0000 | OROMUCOSAL | Status: DC | PRN

## 2018-10-06 MED ORDER — HEPARIN SODIUM (PORCINE) 1000 UNIT/ML IJ SOLN
INTRAMUSCULAR | Status: DC | PRN
  Administered 2018-10-06: 3000 [IU] via INTRAVENOUS
  Administered 2018-10-06 (×2): 1000 [IU] via INTRAVENOUS
  Administered 2018-10-06: 6000 [IU] via INTRAVENOUS

## 2018-10-06 MED ORDER — 0.9 % SODIUM CHLORIDE (POUR BTL) OPTIME
TOPICAL | Status: DC | PRN
  Administered 2018-10-06: 2000 mL

## 2018-10-06 MED ORDER — ONDANSETRON HCL 4 MG/2ML IJ SOLN
INTRAMUSCULAR | Status: DC | PRN
  Administered 2018-10-06: 4 mg via INTRAVENOUS

## 2018-10-06 MED ORDER — SODIUM CHLORIDE 0.9 % IV SOLN
INTRAVENOUS | Status: AC
  Filled 2018-10-06: qty 1.2

## 2018-10-06 MED ORDER — SODIUM CHLORIDE 0.9 % IV SOLN
INTRAVENOUS | Status: DC | PRN
  Administered 2018-10-06: 500 mL

## 2018-10-06 MED ORDER — SUGAMMADEX SODIUM 200 MG/2ML IV SOLN
INTRAVENOUS | Status: DC | PRN
  Administered 2018-10-06: 200 mg via INTRAVENOUS

## 2018-10-06 MED ORDER — ROCURONIUM BROMIDE 100 MG/10ML IV SOLN
INTRAVENOUS | Status: DC | PRN
  Administered 2018-10-06: 10 mg via INTRAVENOUS
  Administered 2018-10-06 (×2): 20 mg via INTRAVENOUS
  Administered 2018-10-06: 50 mg via INTRAVENOUS

## 2018-10-06 MED ORDER — LIDOCAINE 2% (20 MG/ML) 5 ML SYRINGE
INTRAMUSCULAR | Status: AC
  Filled 2018-10-06: qty 5

## 2018-10-06 MED ORDER — POTASSIUM CHLORIDE CRYS ER 20 MEQ PO TBCR: 20.0000 meq | EXTENDED_RELEASE_TABLET | Freq: Every day | ORAL | Status: DC | PRN

## 2018-10-06 MED ORDER — CLOPIDOGREL BISULFATE 75 MG PO TABS
75.0000 mg | ORAL_TABLET | Freq: Every day | ORAL | Status: DC
  Administered 2018-10-06 – 2018-10-08 (×3): 75 mg via ORAL
  Filled 2018-10-06 (×3): qty 1

## 2018-10-06 MED ORDER — PROPOFOL 10 MG/ML IV BOLUS
INTRAVENOUS | Status: AC
  Filled 2018-10-06: qty 20

## 2018-10-06 MED ORDER — GUAIFENESIN-DM 100-10 MG/5ML PO SYRP: 15.0000 mL | ORAL_SOLUTION | ORAL | Status: DC | PRN

## 2018-10-06 MED ORDER — MIDAZOLAM HCL 5 MG/5ML IJ SOLN
INTRAMUSCULAR | Status: DC | PRN
  Administered 2018-10-06: 1 mg via INTRAVENOUS

## 2018-10-06 MED ORDER — PROPOFOL 10 MG/ML IV BOLUS
INTRAVENOUS | Status: DC | PRN
  Administered 2018-10-06: 100 mg via INTRAVENOUS

## 2018-10-06 MED ORDER — ONDANSETRON HCL 4 MG/2ML IJ SOLN
INTRAMUSCULAR | Status: AC
  Filled 2018-10-06: qty 2

## 2018-10-06 MED ORDER — LIDOCAINE HCL (CARDIAC) PF 100 MG/5ML IV SOSY
PREFILLED_SYRINGE | INTRAVENOUS | Status: DC | PRN
  Administered 2018-10-06: 80 mg via INTRATRACHEAL

## 2018-10-06 MED ORDER — LABETALOL HCL 5 MG/ML IV SOLN: 10.0000 mg | INTRAVENOUS | Status: DC | PRN

## 2018-10-06 MED ORDER — ALUM & MAG HYDROXIDE-SIMETH 200-200-20 MG/5ML PO SUSP: 15.0000 mL | ORAL | Status: DC | PRN

## 2018-10-06 MED ORDER — METOPROLOL TARTRATE 5 MG/5ML IV SOLN: 2.0000 mg | INTRAVENOUS | Status: DC | PRN

## 2018-10-06 MED ORDER — HYDROMORPHONE HCL 1 MG/ML IJ SOLN
0.2500 mg | INTRAMUSCULAR | Status: DC | PRN
  Administered 2018-10-06 (×2): 0.5 mg via INTRAVENOUS

## 2018-10-06 MED ORDER — FENTANYL CITRATE (PF) 250 MCG/5ML IJ SOLN
INTRAMUSCULAR | Status: DC | PRN
  Administered 2018-10-06: 3 ug via INTRAVENOUS

## 2018-10-06 MED ORDER — ALBUMIN HUMAN 5 % IV SOLN
INTRAVENOUS | Status: DC | PRN
  Administered 2018-10-06: 11:00:00 via INTRAVENOUS

## 2018-10-06 MED ORDER — ONDANSETRON HCL 4 MG/2ML IJ SOLN: 4.0000 mg | Freq: Once | INTRAMUSCULAR | Status: DC | PRN

## 2018-10-06 MED ORDER — FENTANYL CITRATE (PF) 250 MCG/5ML IJ SOLN
INTRAMUSCULAR | Status: AC
  Filled 2018-10-06: qty 5

## 2018-10-06 MED ORDER — HYDROMORPHONE HCL 1 MG/ML IJ SOLN
INTRAMUSCULAR | Status: AC
  Administered 2018-10-06: 0.5 mg via INTRAVENOUS
  Filled 2018-10-06: qty 1

## 2018-10-06 MED ORDER — MIDAZOLAM HCL 2 MG/2ML IJ SOLN
INTRAMUSCULAR | Status: AC
  Filled 2018-10-06: qty 2

## 2018-10-06 MED ORDER — ACETAMINOPHEN 325 MG PO TABS: 325.0000 mg | ORAL_TABLET | ORAL | Status: DC | PRN

## 2018-10-06 MED ORDER — HEPARIN SODIUM (PORCINE) 1000 UNIT/ML IJ SOLN
INTRAMUSCULAR | Status: AC
  Filled 2018-10-06: qty 1

## 2018-10-06 MED ORDER — SODIUM CHLORIDE 0.9 % IV SOLN
INTRAVENOUS | Status: DC | PRN
  Administered 2018-10-06: 50 ug/min via INTRAVENOUS

## 2018-10-06 MED ORDER — HEMOSTATIC AGENTS (NO CHARGE) OPTIME
TOPICAL | Status: DC | PRN
  Administered 2018-10-06: 1 via TOPICAL

## 2018-10-06 MED ORDER — ROCURONIUM BROMIDE 50 MG/5ML IV SOSY
PREFILLED_SYRINGE | INTRAVENOUS | Status: AC
  Filled 2018-10-06: qty 10

## 2018-10-06 MED ORDER — SODIUM CHLORIDE 0.9 % IV SOLN: 500.0000 mL | Freq: Once | INTRAVENOUS | Status: DC | PRN

## 2018-10-06 SURGICAL SUPPLY — 78 items
BALLN MUSTANG 5.0X40 75 (BALLOONS) ×3
BALLN STERLING RX 4X40X80 (BALLOONS) ×3
BALLOON MUSTANG 5.0X40 75 (BALLOONS) IMPLANT
BALLOON STERLING RX 4X40X80 (BALLOONS) IMPLANT
BANDAGE ACE 4X5 VEL STRL LF (GAUZE/BANDAGES/DRESSINGS) IMPLANT
BANDAGE ESMARK 6X9 LF (GAUZE/BANDAGES/DRESSINGS) IMPLANT
BNDG ESMARK 6X9 LF (GAUZE/BANDAGES/DRESSINGS)
CANISTER SUCT 3000ML PPV (MISCELLANEOUS) ×3 IMPLANT
CATH BEACON 5 .035 65 KMP TIP (CATHETERS) ×1 IMPLANT
CATH EMB 3FR 80CM (CATHETERS) ×1 IMPLANT
CATH EMB 4FR 80CM (CATHETERS) ×1 IMPLANT
CATH POLY DUAL LUMEN OCCL 5FR (CATHETERS) ×2 IMPLANT
CLIP VESOCCLUDE MED 24/CT (CLIP) ×3 IMPLANT
CLIP VESOCCLUDE SM WIDE 24/CT (CLIP) ×3 IMPLANT
COVER BACK TABLE 60X90IN (DRAPES) ×1 IMPLANT
COVER WAND RF STERILE (DRAPES) ×2 IMPLANT
CUFF TOURNIQUET SINGLE 24IN (TOURNIQUET CUFF) IMPLANT
CUFF TOURNIQUET SINGLE 34IN LL (TOURNIQUET CUFF) IMPLANT
CUFF TOURNIQUET SINGLE 44IN (TOURNIQUET CUFF) IMPLANT
DERMABOND ADVANCED (GAUZE/BANDAGES/DRESSINGS) ×1
DERMABOND ADVANCED .7 DNX12 (GAUZE/BANDAGES/DRESSINGS) ×2 IMPLANT
DEVICE INFLATION ENCORE 26 (MISCELLANEOUS) ×1 IMPLANT
DRAIN CHANNEL 15F RND FF W/TCR (WOUND CARE) IMPLANT
DRAPE HALF SHEET 40X57 (DRAPES) ×1 IMPLANT
DRAPE X-RAY CASS 24X20 (DRAPES) IMPLANT
ELECT REM PT RETURN 9FT ADLT (ELECTROSURGICAL) ×3
ELECTRODE REM PT RTRN 9FT ADLT (ELECTROSURGICAL) ×2 IMPLANT
EVACUATOR SILICONE 100CC (DRAIN) IMPLANT
GLOVE BIO SURGEON STRL SZ 6.5 (GLOVE) ×1 IMPLANT
GLOVE BIO SURGEON STRL SZ7.5 (GLOVE) ×2 IMPLANT
GLOVE BIOGEL M 6.5 STRL (GLOVE) ×1 IMPLANT
GLOVE BIOGEL PI IND STRL 7.0 (GLOVE) IMPLANT
GLOVE BIOGEL PI IND STRL 7.5 (GLOVE) ×2 IMPLANT
GLOVE BIOGEL PI INDICATOR 7.0 (GLOVE) ×4
GLOVE BIOGEL PI INDICATOR 7.5 (GLOVE) ×4
GLOVE SURG SS PI 6.5 STRL IVOR (GLOVE) ×3 IMPLANT
GLOVE SURG SS PI 7.5 STRL IVOR (GLOVE) ×6 IMPLANT
GOWN STRL REUS W/ TWL LRG LVL3 (GOWN DISPOSABLE) ×4 IMPLANT
GOWN STRL REUS W/ TWL XL LVL3 (GOWN DISPOSABLE) ×2 IMPLANT
GOWN STRL REUS W/TWL LRG LVL3 (GOWN DISPOSABLE) ×5
GOWN STRL REUS W/TWL XL LVL3 (GOWN DISPOSABLE) ×1
HEMOSTAT SNOW SURGICEL 2X4 (HEMOSTASIS) IMPLANT
KIT BASIN OR (CUSTOM PROCEDURE TRAY) ×3 IMPLANT
KIT TURNOVER KIT B (KITS) ×3 IMPLANT
MARKER GRAFT CORONARY BYPASS (MISCELLANEOUS) IMPLANT
NS IRRIG 1000ML POUR BTL (IV SOLUTION) ×6 IMPLANT
PACK PERIPHERAL VASCULAR (CUSTOM PROCEDURE TRAY) ×3 IMPLANT
PAD ARMBOARD 7.5X6 YLW CONV (MISCELLANEOUS) ×6 IMPLANT
PATCH VASC XENOSURE 1CMX6CM (Vascular Products) ×1 IMPLANT
PATCH VASC XENOSURE 1X6 (Vascular Products) IMPLANT
SET COLLECT BLD 21X3/4 12 (NEEDLE) IMPLANT
SET MICROPUNCTURE 5F STIFF (MISCELLANEOUS) ×1 IMPLANT
SHEATH BRITE TIP 7FRX11 (SHEATH) ×1 IMPLANT
SHEATH PINNACLE 5F 10CM (SHEATH) ×1 IMPLANT
STENT TIGRIS 5X100X120 (Permanent Stent) ×1 IMPLANT
STENT VIABAHN 6X50X120 (Permanent Stent) ×1 IMPLANT
STOPCOCK 4 WAY LG BORE MALE ST (IV SETS) ×1 IMPLANT
SUT ETHILON 3 0 PS 1 (SUTURE) IMPLANT
SUT PROLENE 5 0 C 1 24 (SUTURE) ×4 IMPLANT
SUT PROLENE 6 0 BV (SUTURE) ×8 IMPLANT
SUT PROLENE 7 0 BV 1 (SUTURE) IMPLANT
SUT SILK 2 0 SH (SUTURE) ×2 IMPLANT
SUT SILK 3 0 (SUTURE)
SUT SILK 3-0 18XBRD TIE 12 (SUTURE) IMPLANT
SUT VIC AB 2-0 CT1 27 (SUTURE) ×1
SUT VIC AB 2-0 CT1 TAPERPNT 27 (SUTURE) ×4 IMPLANT
SUT VIC AB 3-0 SH 27 (SUTURE) ×1
SUT VIC AB 3-0 SH 27X BRD (SUTURE) ×4 IMPLANT
SUT VICRYL 4-0 PS2 18IN ABS (SUTURE) ×5 IMPLANT
SYR TB 1ML LUER SLIP (SYRINGE) ×1 IMPLANT
TOWEL GREEN STERILE (TOWEL DISPOSABLE) ×3 IMPLANT
TRAY FOLEY MTR SLVR 16FR STAT (SET/KITS/TRAYS/PACK) ×3 IMPLANT
TUBING EXTENTION W/L.L. (IV SETS) IMPLANT
UNDERPAD 30X30 (UNDERPADS AND DIAPERS) ×3 IMPLANT
WATER STERILE IRR 1000ML POUR (IV SOLUTION) ×3 IMPLANT
WIRE BENTSON .035X145CM (WIRE) ×1 IMPLANT
WIRE G V18X300CM (WIRE) ×1 IMPLANT
WIRE HI TORQ VERSACORE J 260CM (WIRE) ×2 IMPLANT

## 2018-10-06 NOTE — Progress Notes (Signed)
Pt to or via trransport

## 2018-10-06 NOTE — Op Note (Signed)
Patient name: Shelia Williams MRN: [id number] DOB: 1950/11/28 Sex: female  10/06/2018 Pre-operative Diagnosis: Ischemic right leg Post-operative diagnosis:  Same Surgeon:  [name] Assistants: Luanne Bras Procedure:   #1: Redo right femoral artery exposure   #2: Thrombectomy of right femoral-popliteal bypass graft   #3: Right common femoral and profundofemoral endarterectomy with bovine pericardial patch angioplasty   #4: Right lower extremity runoff   #5: Stent, right popliteal artery Anesthesia: General Blood Loss: 150 cc Specimens: None  Findings: Significant calcified plaque at the origin of the bypass graft.  Endarterectomy was performed in the common femoral and profundofemoral artery.  I placed a bovine pericardial patch on the distal common femoral artery extending onto the Gore-Tex bypass graft.  Thrombectomy of the bypass graft was performed.  There was residual disease at the distal anastomosis which was treated with a 6 x 50 Viabahn stent.  She had residual plaque/stenosis/dissection in the popliteal artery behind the knee and so a 5 x 100 Tigris stent was placed.  She had single-vessel runoff via the anterior tibial artery at the end of the case  Indications: The patient has a history of bilateral above-knee femoral-popliteal bypass graft with Gore-Tex at a different institution.  She came into the ER last night with a several day history of pain and numbness in her foot.  CT scan showed that the bypass graft on the right was occluded.  She is here today for revascularization.  She does have metastatic lung cancer with mets to the brain.  I discussed with oncology before the procedure if it was safe to proceed with heparinization and they said it was.  Procedure:  The patient was identified in the holding area and taken to [facility] [facility]  The patient was then placed supine on the table. general anesthesia was administered.  The patient was prepped and draped in the usual  sterile fashion.  A time out was called and antibiotics were administered.  The patient's previous femoral incision was opened with a 10 blade.  Cautery was used about subcutaneous tissue down to the femoral sheath.  I individually isolated the Gore-Tex bypass graft, common femoral, and profundofemoral artery.  The distal common femoral artery was heavily calcified.  The patient was fully heparinized.  An 11 blade was used to make an arteriotomy beginning on the Gore-Tex graft extending up onto the distal common femoral artery.  There was extensive calcified plaque which was nearly occlusive going into the bypass graft as well as into the proximal profundofemoral artery.  I performed an endarterectomy of the bypass graft, profundofemoral artery origin and common femoral artery up to the inguinal ligament.  With this, there was excellent inflow into the graft.  I then used a #4 Fogarty catheter to perform a thrombectomy of the graft.  At 40 cm I met resistance and could not get past this area even with a 3 mm Fogarty I then performed an arteriogram and there appeared to be a stenosis at the distal bypass graft and the filling defect in the mid popliteal artery.  I inserted a versa core wire and navigated this into the anterior tibial artery.  An over-the-wire Fogarty was then used to perform a thrombectomy.  Plaque-like material and neointima was removed.  There was still a filling defect on angiography at the distal anastomosis and so a 6 x 50 Viabahn stent was deployed across this area and molded with a 5 mm balloon.  An additional arteriogram was performed  and there did appear to be a dissection or stenosis in the popliteal artery at the joint space.  I felt that this needed to be addressed I did not know whether this was acute or chronic.  A 5 x 100 Tigris stent was deployed going just across the joint space and extending up into the previously placed Viabahn stent.  It was molded with a 5 mm balloon.   Angiography was then performed and there was now single-vessel runoff via the anterior tibial artery.  I was satisfied with these results.  The bovine patch hole was closed with 6-0 Prolene.  Blood flow was reestablished.  She had an excellent Doppler signal in the anterior tibial artery.  50 mg of protamine was administered.  Once hemostasis was satisfactory the incision was closed with multiple layers of 2-0 and 3-0 Vicryl followed by Dermabond.  She was successfully extubated taken recovery in stable condition.  There are no immediate complications.   Disposition: To PACU stable.   Theotis Burrow, M.D. [facility] Office: [phone] Pager:  [phone]

## 2018-10-06 NOTE — Anesthesia Procedure Notes (Signed)
Procedure Name: Intubation Date/Time: 10/06/2018 8:04 AM Performed by: Mariea Clonts, CRNA Pre-anesthesia Checklist: Patient identified, Emergency Drugs available, Suction available and Patient being monitored Patient Re-evaluated:Patient Re-evaluated prior to induction Oxygen Delivery Method: Circle System Utilized Preoxygenation: Pre-oxygenation with 100% oxygen Induction Type: IV induction Ventilation: Mask ventilation without difficulty Laryngoscope Size: Miller and 2 Grade View: Grade I Tube type: Oral Tube size: 7.0 mm Number of attempts: 1 Airway Equipment and Method: Stylet and Oral airway Placement Confirmation: ETT inserted through vocal cords under direct vision,  positive ETCO2 and breath sounds checked- equal and bilateral Tube secured with: Tape Dental Injury: Teeth and Oropharynx as per pre-operative assessment

## 2018-10-06 NOTE — Interval H&P Note (Signed)
History and Physical Interval Note:  10/06/2018 7:35 AM  Shelia Williams  has presented today for surgery, with the diagnosis of ISCHEMIC LEG  The various methods of treatment have been discussed with the patient and family. After consideration of risks, benefits and other options for treatment, the patient has consented to  Procedure(s): RIGHT LEG THROMBECTOMY, POSSIBLE STENT (Right) as a surgical intervention .  The patient's history has been reviewed, patient examined, no change in status, stable for surgery.  I have reviewed the patient's chart and labs.  Questions were answered to the patient's satisfaction.     [name]

## 2018-10-06 NOTE — Anesthesia Postprocedure Evaluation (Signed)
Anesthesia Post Note  Patient: Teletha Petrea  Procedure(s) Performed: RIGHT FEMORAL ARTERY THROMBECTOMY (Right ) INTRA OPERATIVE ARTERIOGRAM WITH RIGHT LEG RUNOFF PATCH ANGIOPLASTY RIGHT FEMORAL ARTERY (Right ) INSERTION OF STENT IN RIGHT FEMORAL POPLITEAL GRAFT, INSERTION OF STENT IN RIGHT POPLITEAL TO TIBIAL ARTERY (Right )     Patient location during evaluation: PACU Anesthesia Type: General Level of consciousness: awake and alert Pain management: pain level controlled Vital Signs Assessment: post-procedure vital signs reviewed and stable Respiratory status: spontaneous breathing, nonlabored ventilation, respiratory function stable and patient connected to nasal cannula oxygen Cardiovascular status: blood pressure returned to baseline and stable Postop Assessment: no apparent nausea or vomiting Anesthetic complications: no    Last Vitals:  Vitals:   10/06/18 1245 10/06/18 1300  BP: (!) 114/48   Pulse: 80 81  Resp: 15 14  Temp:    SpO2: 97% 96%    Last Pain:  Vitals:   10/06/18 1300  TempSrc:   PainSc: [place] DAVID

## 2018-10-06 NOTE — Transfer of Care (Signed)
Immediate Anesthesia Transfer of Care Note  Patient: Shelia Williams  Procedure(s) Performed: RIGHT FEMORAL ARTERY THROMBECTOMY (Right ) INTRA OPERATIVE ARTERIOGRAM WITH RIGHT LEG RUNOFF PATCH ANGIOPLASTY RIGHT FEMORAL ARTERY (Right ) INSERTION OF STENT IN RIGHT FEMORAL POPLITEAL GRAFT, INSERTION OF STENT IN RIGHT POPLITEAL TO TIBIAL ARTERY (Right )  Patient Location: PACU  Anesthesia Type:General  Level of Consciousness: awake, alert  and oriented  Airway & Oxygen Therapy: Patient Spontanous Breathing and Patient connected to nasal cannula oxygen  Post-op Assessment: Report given to RN, Post -op Vital signs reviewed and stable and Patient moving all extremities X 4  Post vital signs: Reviewed and stable  Last Vitals:  Vitals Value Taken Time  BP    Temp    Pulse 83 10/06/2018 12:13 PM  Resp 20 10/06/2018 12:13 PM  SpO2 98 % 10/06/2018 12:13 PM  Vitals shown include unvalidated device data.  Last Pain:  Vitals:   10/06/18 0408  TempSrc: Oral  PainSc: 3          Complications: No apparent anesthesia complications

## 2018-10-06 NOTE — Progress Notes (Signed)
PROGRESS NOTE    Shelia Williams  BFX:[id number] DOB: 03-31-1951 DOA: 10/05/2018 PCP: System, Pcp Not In  Brief Narrative: 67 y.o. female with medical history significant for PAD, hypertension, lung cancer with mets to brain, who presented to the Ed with c/o of right leg pain progressively worsened in severity and extended up her thigh to her right lower back and down to her foot, over the past 5 days.  No swelling no redness.  2014-patient reports she has had surgeries to both legs for arterial occlusions, [facility].  Patient receives her care in [facility], but after her mom passed away in 09-05-2023, she decided to stay here with her daughter to Christmas.  ED Course: Stable vitals, Unremarkable CBC, BMP, Normal lactic acid- 0.98.  CT angio AO + BIFEM -complete occlusion of the right femoropopliteal bypass graft with reconstitution of small amount of flow in the distal SFA and popliteal artery.  Vascular surgery- Dr. Trula Slade, consulted in the ED recommended hospitalist admission, also questioned if IV heparin could be given Intra-Op tomorrow  Assessment & Plan:   Principal Problem:   Right leg pain Active Problems:   COPD (chronic obstructive pulmonary disease) ([place])   Lung cancer metastatic to brain ([place])   PVD (peripheral vascular disease) ([place])   1 complete occlusion of the right lower extremity bypass now status post thrombectomy and stent placement.  Patient to be placed on Plavix.  Patient feels the pain is almost resolved.  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  Patient follows up at [facility] oncology.  Last MRI of the brain in August 2019 showed redemonstration of multifocal intracranial mets.  2 cerebellar lesions were not appreciably changed in size.  Interval increase in the size of left cerebellar lesion with mild increase in edema.  No significant mass-effect.  Last  chest CTA 2019 persistent less well-defined mass in the right middle lobe  3 hypertension soft but stable  4 COPD stable stable     Nutrition Interventions:     Estimated body mass index is 24.27 kg/m as calculated from the following:   Height as of this encounter: 5\' 2"  (1.575 m).   Weight as of this encounter: 60.2 kg.  DVT prophylaxis:SCD Code Status: Full code Family Communication: None Disposition Plan:  Pending clinical improvement  Consultants: Vascular  Procedures: Status post right femoral artery thrombectomy intraoperative arteriogram patch angioplasty right femoral artery insertion of stent in the right femoral and right popliteal to tibial artery Antimicrobials:   Subjective: Patient resting in bed reports that she is tired but no complaints some bruise in the right groin otherwise no lower extremity pain or edema at this time   Objective: Vitals:   10/06/18 1230 10/06/18 1245 10/06/18 1300 10/06/18 1305  BP: (!) 113/45 (!) 114/48 (!) 103/46 (!) 112/47  Pulse: 78 80 81 81  Resp: 15 15 14 16   Temp:      TempSrc:      SpO2: 97% 97% 96% 99%  Weight:      Height:        Intake/Output Summary (Last 24 hours) at 10/06/2018 1521 Last data filed at 10/06/2018 1205 Gross per 24 hour  Intake 2392.47 ml  Output 760 ml  Net 1632.47 ml   Filed Weights   10/05/18 1349 10/05/18 2331  Weight: 59 kg 60.2 kg    Examination:  General exam: Appears calm and comfortable  Respiratory system: Clear to auscultation. Respiratory effort normal. Cardiovascular system: S1 & S2 heard, RRR. No JVD, murmurs, rubs, gallops or clicks. No pedal edema. Gastrointestinal system: Abdomen is nondistended, soft and nontender. No organomegaly or masses felt. Normal bowel sounds heard. Central nervous system: Alert and oriented. No focal neurological deficits. Extremities: Symmetric 5 x 5 power.  Bruises noted in the right inguinal area Skin: No rashes, lesions or ulcers Psychiatry:  Judgement and insight appear normal. Mood & affect appropriate.     Data Reviewed: I have personally reviewed following labs and imaging studies  CBC: Recent Labs  Lab 10/05/18 1835 10/05/18 1847 10/06/18 0351  WBC 9.8  --  7.5  NEUTROABS 5.5  --   --   HGB 14.0 15.0 13.4  HCT 44.5 44.0 43.7  MCV 91.8  --  91.8  PLT 218  --  341   Basic Metabolic Panel: Recent Labs  Lab 10/05/18 1847 10/06/18 0351  NA 138 139  K 3.7 4.0  CL 102 101  CO2  --  27  GLUCOSE 89 109*  BUN 14 8  CREATININE 0.80 0.85  CALCIUM  --  8.9   GFR: Estimated Creatinine Clearance: 54.9 mL/min (by C-G formula based on SCr of 0.85 mg/dL). Liver Function Tests: No results for input(s): AST, ALT, ALKPHOS, BILITOT, PROT, ALBUMIN in the last 168 hours. No results for input(s): LIPASE, AMYLASE in the last 168 hours. No results for input(s): AMMONIA in the last 168 hours. Coagulation Profile: Recent Labs  Lab 10/06/18 0351  INR 0.91   Cardiac Enzymes: No results for input(s): CKTOTAL, CKMB, CKMBINDEX, TROPONINI in the last 168 hours. BNP (last 3 results) No results for input(s): PROBNP in the last 8760 hours. HbA1C: No results for input(s): HGBA1C in the last 72 hours. CBG: No results for input(s): GLUCAP in the last 168 hours. Lipid Profile: No results for input(s): CHOL, HDL, LDLCALC, TRIG, CHOLHDL, LDLDIRECT in the last 72 hours. Thyroid Function Tests: No results for input(s): TSH, T4TOTAL, FREET4, T3FREE, THYROIDAB in the last 72 hours. Anemia Panel: No results for input(s): VITAMINB12, FOLATE, FERRITIN, TIBC, IRON, RETICCTPCT in the last 72 hours. Sepsis Labs: Recent Labs  Lab 10/05/18 1847  LATICACIDVEN 0.98    Recent Results (from the past 240 hour(s))  Surgical pcr screen     Status: None   Collection Time: 10/05/18 11:48 PM  Result Value Ref Range Status   MRSA, PCR NEGATIVE NEGATIVE Final   Staphylococcus aureus NEGATIVE NEGATIVE Final    Comment: (NOTE) The Xpert SA Assay  (FDA approved for NASAL specimens in patients 31 years of age and older), is one component of a comprehensive surveillance program. It is not intended to diagnose infection nor to guide or monitor treatment. Performed at [facility], [place] [address] [place], [place] [address]          Radiology Studies: Dg Chest 2 View  Result Date: 10/05/2018 CLINICAL DATA:  Preoperative respiratory evaluation. EXAM: CHEST - 2 VIEW COMPARISON:  12/28/2015 FINDINGS: The lungs are clear without focal pneumonia, edema, pneumothorax or pleural effusion. The cardiopericardial silhouette is within normal limits for size. The visualized bony structures of the thorax are intact. IMPRESSION: No active cardiopulmonary disease. Electronically Signed   By: Misty Stanley M.D.   On: 10/05/2018 23:24   Ct Angio Aortobifemoral W And/or Wo Contrast  Result Date: 10/05/2018 CLINICAL DATA:  67 year old female with lower extremity numbness and coldness. EXAM: CT ANGIOGRAPHY OF ABDOMINAL AORTA WITH ILIOFEMORAL  RUNOFF TECHNIQUE: Multidetector CT imaging of the abdomen, pelvis and lower extremities was performed using the standard protocol during bolus administration of intravenous contrast. Multiplanar CT image reconstructions and MIPs were obtained to evaluate the vascular anatomy. CONTRAST:  135mL ISOVUE-370 IOPAMIDOL (ISOVUE-370) INJECTION 76% COMPARISON:  None. FINDINGS: VASCULAR Aorta: Advanced calcified and noncalcified plaque of the aorta. There is a large coarse calcification of the abdominal aorta at the level of the renal arteries with associated moderate focal narrowing of the lumen. No aneurysmal dilatation or dissection. Celiac: There is atherosclerotic calcification of the origin of the celiac axis. The celiac artery remains patent. SMA: Patent without evidence of aneurysm, dissection, vasculitis or significant stenosis. Renals: There is atherosclerotic calcification of the ostia of the renal arteries. The renal  arteries remain patent. There is an accessory right renal artery arising from the distal aorta extending to the inferior pole of the right kidney. IMA: Atherosclerotic calcification of the IMA.  The IMA is patent. RIGHT Lower Extremity Inflow: There is advanced atherosclerotic calcification of the common, internal, and external iliac arteries. The external iliac artery is patent. There is diminished flow in the internal iliac artery. Outflow: There is occlusion of the common femoral, and superficial femoral arteries. There is diminished flow in the deep femoral artery. A femoropopliteal bypass graft is seen which is completely occluded. There is reconstitution of some flow in the distal SFA. The distal SFA is moderately atherosclerotic. There is atherosclerotic calcification and luminal narrowing of the popliteal artery with diminished flow. Runoff: There is flow in the fibular artery and anterior tibial artery and dorsalis pedis artery. The posterior tibial artery is occluded and atherosclerotic. LEFT Lower Extremity Inflow: There is advanced atherosclerotic calcification of the common, internal, and external iliac arteries. There is diffuse luminal narrowing of the external iliac artery with diminished flow. This vessel however remains patent. Outflow: There is complete occlusion of the left common femoral artery and SFA. A femoropopliteal bypass graft is noted. There is intraluminal clot along the periphery of the graft with associated mild luminal narrowing. The graft however remains patent. There is atherosclerotic disease of the distal SFA and popliteal artery with associated luminal narrowing. There is flow within these vessels. Runoff: There is flow in the anterior tibial artery, dorsalis pedis artery, and peroneal artery. The posterior tibial artery is atherosclerotic and occluded. Veins: No obvious venous abnormality within the limitations of this arterial phase study. Review of the MIP images confirms the  above findings. NON-VASCULAR Lower chest: The visualized lung bases are clear. There is coronary vascular calcification. No intra-abdominal free air or free fluid. Hepatobiliary: No focal liver abnormality is seen. No gallstones, gallbladder wall thickening, or biliary dilatation. Pancreas: Unremarkable. No pancreatic ductal dilatation or surrounding inflammatory changes. Spleen: Normal in size without focal abnormality. Adrenals/Urinary Tract: Adrenal glands are unremarkable. Kidneys are normal, without renal calculi, focal lesion, or hydronephrosis. Bladder is unremarkable. Stomach/Bowel: There is no bowel obstruction or active inflammation. There is a 3 cm distal duodenal diverticulum. Moderate stool throughout the colon. The appendix is normal. Lymphatic: No adenopathy. Reproductive: Hysterectomy.  No pelvic mass. Other: None Musculoskeletal: Disc desiccation at L5-S1. No acute osseous pathology. IMPRESSION: VASCULAR CLINICAL DATA:  67 year old female with lower extremity numbness and coldness. EXAM: CT ANGIOGRAPHY OF ABDOMINAL AORTA WITH ILIOFEMORAL RUNOFF TECHNIQUE: Multidetector CT imaging of the abdomen, pelvis and lower extremities was performed using the standard protocol during bolus administration of intravenous contrast. Multiplanar CT image reconstructions and MIPs were obtained to evaluate the vascular  anatomy. CONTRAST:  155mL ISOVUE-370 IOPAMIDOL (ISOVUE-370) INJECTION 76% COMPARISON:  None. FINDINGS: VASCULAR Aorta: Advanced calcified and noncalcified plaque of the aorta. There is a large coarse calcification of the abdominal aorta at the level of the renal arteries with associated moderate focal narrowing of the lumen. No aneurysmal dilatation or dissection. Celiac: There is atherosclerotic calcification of the origin of the celiac axis. The celiac artery remains patent. SMA: Patent without evidence of aneurysm, dissection, vasculitis or significant stenosis. Renals: There is atherosclerotic  calcification of the ostia of the renal arteries. The renal arteries remain patent. There is an accessory right renal artery arising from the distal aorta extending to the inferior pole of the right kidney. IMA: Atherosclerotic calcification of the IMA. The IMA is patent. RIGHT Lower Extremity Inflow: There is advanced atherosclerotic calcification of the common, internal, and external iliac arteries. The external iliac artery is patent. There is diminished flow in the internal iliac artery. Outflow: There is occlusion of the common femoral, and superficial femoral arteries. There is diminished flow in the deep femoral artery. A femoropopliteal bypass graft is seen which is completely occluded. There is reconstitution of some flow in the distal SFA. The distal SFA is moderately atherosclerotic. There is atherosclerotic calcification and luminal narrowing of the popliteal artery with diminished flow. Runoff: There is flow in the fibular artery and anterior tibial artery and dorsalis pedis artery. The posterior tibial artery is occluded and atherosclerotic. LEFT Lower Extremity Inflow: There is advanced atherosclerotic calcification of the common, internal, and external iliac arteries. There is diffuse luminal narrowing of the external iliac artery with diminished flow. This vessel however remains patent. Outflow: There is complete occlusion of the left common femoral artery and SFA. A femoropopliteal bypass graft is noted. There is intraluminal clot along the periphery of the graft with associated mild luminal narrowing. The graft however remains patent. There is atherosclerotic disease of the distal SFA and popliteal artery with associated luminal narrowing. There is flow within these vessels. Runoff: There is flow in the anterior tibial artery, dorsalis pedis artery, and peroneal artery. The posterior tibial artery is atherosclerotic and occluded. Veins: No obvious venous abnormality within the limitations of this  arterial phase study. Review of the MIP images confirms the above findings. NON-VASCULAR Lower chest: The visualized lung bases are clear. There is coronary vascular calcification. No intra-abdominal free air or free fluid. Hepatobiliary: No focal liver abnormality is seen. No gallstones, gallbladder wall thickening, or biliary dilatation. Pancreas: Unremarkable. No pancreatic ductal dilatation or surrounding inflammatory changes. Spleen: Normal in size without focal abnormality. Adrenals/Urinary Tract: Adrenal glands are unremarkable. Kidneys are normal, without renal calculi, focal lesion, or hydronephrosis. Bladder is unremarkable. Stomach/Bowel: There is no bowel obstruction or active inflammation. There is a 3 cm distal duodenal diverticulum. Moderate stool throughout the colon. The appendix is normal. Lymphatic: No adenopathy. Reproductive: Hysterectomy. No pelvic mass. Other: None Musculoskeletal: Disc desiccation at L5-S1. No acute osseous pathology. IMPRESSION: VASCULAR 1. Complete occlusion of the right femoropopliteal bypass graft with reconstitution of small amount of flow in the distal SFA and popliteal artery. 2. Luminal narrowing of the left femoropopliteal bypass graft. The graft remains patent. 3. Patent appearance of the anterior tibial arteries and peroneal arteries bilaterally. Occluded peroneal arteries. 4. Advanced atherosclerotic calcification of the abdominal aorta. NON-VASCULAR No acute intra-abdominal or pelvic pathology. : NON-VASCULAR Electronically Signed   By: Anner Crete M.D.   On: 10/05/2018 22:24        Scheduled Meds: . buPROPion  100 mg Oral TID WC  . fluticasone furoate-vilanterol  1 puff Inhalation Daily   And  . umeclidinium bromide  1 puff Inhalation Daily  . LORazepam  1 mg Oral QHS   Continuous Infusions:   LOS: 1 day    Georgette Shell, MD [facility]  If 7PM-7AM, please contact night-coverage [email or web address] Password [email or web address] 10/06/2018,  3:21 PM

## 2018-10-06 NOTE — Anesthesia Preprocedure Evaluation (Signed)
Anesthesia Evaluation  Patient identified by MRN, date of birth, ID band Patient awake    Reviewed: Allergy & Precautions, NPO status , Patient's Chart, lab work & pertinent test results  Airway Mallampati: I  TM Distance: >3 FB Neck ROM: Full    Dental   Pulmonary COPD, Current Smoker,    Pulmonary exam normal        Cardiovascular hypertension, Pt. on medications Normal cardiovascular exam     Neuro/Psych    GI/Hepatic   Endo/Other    Renal/GU      Musculoskeletal   Abdominal   Peds  Hematology   Anesthesia Other Findings   Reproductive/Obstetrics                             Anesthesia Physical Anesthesia Plan  ASA: III  Anesthesia Plan: General   Post-op Pain Management:    Induction: Intravenous  PONV Risk Score and Plan: 2 and Ondansetron and Treatment may vary due to age or medical condition  Airway Management Planned: LMA  Additional Equipment:   Intra-op Plan:   Post-operative Plan: Extubation in OR  Informed Consent: I have reviewed the patients History and Physical, chart, labs and discussed the procedure including the risks, benefits and alternatives for the proposed anesthesia with the patient or authorized representative who has indicated his/her understanding and acceptance.     Plan Discussed with: CRNA and Surgeon  Anesthesia Plan Comments:         Anesthesia Quick Evaluation

## 2018-10-06 NOTE — Progress Notes (Signed)
POST_OP Brisk AT signal Compartments soft Start ASA/Plavix  Wells Brabham

## 2018-10-07 ENCOUNTER — Telehealth: Payer: Self-pay | Admitting: Surgery

## 2018-10-07 ENCOUNTER — Encounter (HOSPITAL_COMMUNITY): Payer: Self-pay | Admitting: Surgery

## 2018-10-07 LAB — BASIC METABOLIC PANEL
ANION GAP: 6 (ref 5–15)
BUN: 6 mg/dL — ABNORMAL LOW (ref 8–23)
CHLORIDE: 102 mmol/L (ref 98–111)
CO2: 31 mmol/L (ref 22–32)
Calcium: 8.2 mg/dL — ABNORMAL LOW (ref 8.9–10.3)
Creatinine, Ser: 0.9 mg/dL (ref 0.44–1.00)
GFR calc Af Amer: >60 mL/min (ref >60)
GFR calc non Af Amer: >60 mL/min (ref >60)
Glucose, Bld: 111 mg/dL — ABNORMAL HIGH (ref 70–99)
POTASSIUM: 3.9 mmol/L (ref 3.5–5.1)
Sodium: 139 mmol/L (ref 135–145)

## 2018-10-07 LAB — POCT ACTIVATED CLOTTING TIME
Activated Clotting Time: 180 seconds
Activated Clotting Time: 263 seconds

## 2018-10-07 LAB — CBC
HCT: 31.4 % — ABNORMAL LOW (ref 36.0–46.0)
Hemoglobin: 9.5 g/dL — ABNORMAL LOW (ref 12.0–15.0)
MCH: 28.4 pg (ref 26.0–34.0)
MCHC: 30.3 g/dL (ref 30.0–36.0)
MCV: 94 fL (ref 80.0–100.0)
NRBC: 0 % (ref 0.0–0.2)
Platelets: 174 10*3/uL (ref 150–400)
RBC: 3.34 MIL/uL — AB (ref 3.87–5.11)
RDW: 13.7 % (ref 11.5–15.5)
WBC: 7.2 10*3/uL (ref 4.0–10.5)

## 2018-10-07 MED ORDER — CLOPIDOGREL BISULFATE 75 MG PO TABS: 75.0000 mg | ORAL_TABLET | Freq: Every day | ORAL | 11 refills | Status: DC

## 2018-10-07 MED ORDER — POLYETHYLENE GLYCOL 3350 17 G PO PACK: 17.0000 g | PACK | Freq: Every day | ORAL | 0 refills | Status: AC | PRN

## 2018-10-07 MED ORDER — PANTOPRAZOLE SODIUM 40 MG PO TBEC: 40.0000 mg | DELAYED_RELEASE_TABLET | Freq: Every day | ORAL | 1 refills | Status: AC

## 2018-10-07 MED ORDER — CLOPIDOGREL BISULFATE 75 MG PO TABS: 75.0000 mg | ORAL_TABLET | Freq: Every day | ORAL | 1 refills | Status: AC

## 2018-10-07 MED ORDER — CLOPIDOGREL BISULFATE 75 MG PO TABS: 75.0000 mg | ORAL_TABLET | Freq: Every day | ORAL | 1 refills | Status: DC

## 2018-10-07 MED FILL — POLYETHYLENE GLYCOL 3350 PO: 14 days supply | Qty: 238 | Fill #0

## 2018-10-07 MED FILL — CLOPIDOGREL 75 MG TABLET: 75 | 30 days supply | Qty: 30 | Fill #0 | Status: TO

## 2018-10-07 MED FILL — PANTOPRAZOLE SOD DR 40 MG T: 40 | 30 days supply | Qty: 30 | Fill #0 | Status: TO

## 2018-10-07 NOTE — Telephone Encounter (Signed)
-----   Message from Ulyses Amor, [place] sent at 10/07/2018  8:42 AM EST -----  S/P right LE thrombectomy f/u with Dr. Trula Slade in 2-3 weeks.

## 2018-10-07 NOTE — Progress Notes (Signed)
PT Cancellation Note  Patient Details Name: Shelia Williams MRN: [id number] DOB: 08/29/1951   Cancelled Treatment:    Reason Eval/Treat Not Completed: Pain limiting ability to participate.  Pt was seen earlier by OT but when PT arrived, was waiting for stronger pain meds.  Try again at another time.   Ramond Dial 10/07/2018, 1:47 PM   Mee Hives, PT MS Acute Rehab Dept. Number: [place] and [place]

## 2018-10-07 NOTE — Progress Notes (Signed)
    Subjective  - POD #2  Still with right heel pain Walked this am   Physical Exam:  Brisk AT doppler signal Compartments soft    Assessment/Plan:  POD #2  OK for d/c home Needs rx for Plavix F/u in 2-3 weeks  Wells Kelsi Benham 10/07/2018 8:28 AM --  Vitals:   10/07/18 0400 10/07/18 0758  BP: (!) 113/54   Pulse: 85   Resp: 16   Temp: 98.8 F (37.1 C)   SpO2: 98% 96%    Intake/Output Summary (Last 24 hours) at 10/07/2018 0828 Last data filed at 10/07/2018 0402 Gross per 24 hour  Intake 1870 ml  Output 1510 ml  Net 360 ml     Laboratory CBC    Component Value Date/Time   WBC 7.2 10/07/2018 0356   HGB 9.5 (L) 10/07/2018 0356   HCT 31.4 (L) 10/07/2018 0356   PLT 174 10/07/2018 0356    BMET    Component Value Date/Time   NA 139 10/07/2018 0356   K 3.9 10/07/2018 0356   CL 102 10/07/2018 0356   CO2 31 10/07/2018 0356   GLUCOSE 111 (H) 10/07/2018 0356   BUN 6 (L) 10/07/2018 0356   CREATININE 0.90 10/07/2018 0356   CALCIUM 8.2 (L) 10/07/2018 0356   GFRNONAA >60 10/07/2018 0356   GFRAA >60 10/07/2018 0356    COAG Lab Results  Component Value Date   INR 0.91 10/06/2018   No results found for: PTT  Antibiotics Anti-infectives (From admission, onward)   Start     Dose/Rate Route Frequency Ordered Stop   10/06/18 0600  vancomycin (VANCOCIN) IVPB 1000 mg/200 mL premix     1,000 mg 200 mL/hr over 60 Minutes Intravenous To Short Stay 10/05/18 2239 10/06/18 0912       V. Leia Alf, M.D. [facility] Office: [phone] Pager:  [phone]

## 2018-10-07 NOTE — Telephone Encounter (Signed)
sch appt spk to pt mld ltr 11/04/2018 1030am p/o MD

## 2018-10-07 NOTE — Progress Notes (Signed)
Pt c/o increased pain & burning in R groin incision area.   Area lightly washed with soap and water and dried.  Burning resolved with continued c/o pain.  MD notified and ultra sound ordered.  No changes in pain thus far.  No changes in vital signs.

## 2018-10-07 NOTE — Progress Notes (Signed)
Pt ambulated x 100 feet with front wheel walker pt tolerated well

## 2018-10-07 NOTE — Evaluation (Signed)
Occupational Therapy Evaluation Patient Details Name: Shelia Williams MRN: [id number] DOB: Aug 05, 1951 Today's Date: 10/07/2018    History of Present Illness s/p R femoral artery thrombectomy with redo of R fem-pop BPG, R popliteal to tibial artery stent. PMH: lung ca with brain mets, smoker, COPD, HTN.   Clinical Impression   Pt visiting her family here following the death of pt's mother upon admission. Pt lives in [place] and functions independently. Pt presents with post operative pain, generalized weakness and decreased standing balance. She has access to DME from her mother's home as needed. Pt requires min to min guard assist for mobility and up to min assist for ADL. She will have 24 hour care of her daughter at home. Do not anticipate pt will need follow up OT. Will follow acutely.    Follow Up Recommendations  No OT follow up    Equipment Recommendations  None recommended by OT    Recommendations for Other Services       Precautions / Restrictions Precautions Precautions: Fall      Mobility Bed Mobility Overal bed mobility: Needs Assistance Bed Mobility: Supine to Sit;Sit to Supine     Supine to sit: Min guard Sit to supine: Min assist      Transfers Overall transfer level: Needs assistance Equipment used: 1 person hand held assist Transfers: Sit to/from [facility] Sit to Stand: Min guard Stand pivot transfers: Min guard            Balance Overall balance assessment: Needs assistance   Sitting balance-Leahy Scale: Good       Standing balance-Leahy Scale: Poor                             ADL either performed or assessed with clinical judgement   ADL Overall ADL's : Needs assistance/impaired Eating/Feeding: Independent;Sitting   Grooming: Wash/dry hands;Standing;Min guard   Upper Body Bathing: Set up;Sitting   Lower Body Bathing: Minimal assistance;Sit to/from stand Lower Body Bathing Details (indicate cue type and  reason): recommended long handled bath sponge Upper Body Dressing : Set up;Sitting   Lower Body Dressing: Minimal assistance;Sit to/from stand Lower Body Dressing Details (indicate cue type and reason): recommended use of reacher, slip on shoes, to dress operated leg first Toilet Transfer: Min guard;Stand-pivot;BSC   [place] and Hygiene: Min guard;Sit to/from stand               Vision Patient Visual Report: No change from baseline       Perception     Praxis      Pertinent Vitals/Pain Pain Assessment: Faces Faces Pain Scale: Hurts little more Pain Location: R LE Pain Descriptors / Indicators: Sore Pain Intervention(s): Monitored during session;Premedicated before session;Repositioned     Hand Dominance Right   Extremity/Trunk Assessment Upper Extremity Assessment Upper Extremity Assessment: Overall WFL for tasks assessed   Lower Extremity Assessment Lower Extremity Assessment: Defer to PT evaluation       Communication Communication Communication: No difficulties   Cognition Arousal/Alertness: Awake/alert Behavior During Therapy: WFL for tasks assessed/performed Overall Cognitive Status: Within Functional Limits for tasks assessed                                     General Comments       Exercises     Shoulder Instructions  Home Living Family/patient expects to be discharged to:: Private residence Living Arrangements: Children Available Help at Discharge: Family;Available 24 hours/day Type of Home: House Home Access: Stairs to enter [employer] of Steps: 3 Entrance Stairs-Rails: Right Home Layout: One level     Bathroom Shower/Tub: Tub/shower unit;Walk-in shower   Bathroom Toilet: Standard     Home Equipment: Environmental consultant - 4 wheels;Bedside commode;Shower seat;Adaptive equipment Adaptive Equipment: Reacher Additional Comments: equipment was her mother's who recently died      Prior  Functioning/Environment Level of Independence: Independent        Comments: pt will stay with her daughter until she recovers, pt is from [facility]        OT Problem List: Impaired balance (sitting and/or standing);Decreased knowledge of use of DME or AE;Pain;Cardiopulmonary status limiting activity      OT Treatment/Interventions: Self-care/ADL training;DME and/or AE instruction;Patient/family education;Balance training;Therapeutic activities    OT Goals(Current goals can be found in the care plan section) Acute Rehab OT Goals Patient Stated Goal: to return to PLOF OT Goal Formulation: With patient Time For Goal Achievement: 10/21/18 Potential to Achieve Goals: Good ADL Goals Pt Will Perform Grooming: with modified independence;standing Pt Will Perform Lower Body Bathing: with modified independence;sit to/from stand Pt Will Perform Lower Body Dressing: with modified independence;sit to/from stand Pt Will Transfer to Toilet: with modified independence;ambulating;regular height toilet Pt Will Perform Toileting - Clothing Manipulation and hygiene: with modified independence;sit to/from stand Pt Will Perform Tub/Shower Transfer: Shower transfer;with supervision;ambulating;3 in 1  OT Frequency: Min 2X/week   Barriers to D/C:            Co-evaluation              AM-PAC OT "6 Clicks" Daily Activity     Outcome Measure Help from another person eating meals?: None Help from another person taking care of personal grooming?: A Little Help from another person toileting, which includes using toliet, bedpan, or urinal?: A Little Help from another person bathing (including washing, rinsing, drying)?: A Little Help from another person to put on and taking off regular upper body clothing?: None Help from another person to put on and taking off regular lower body clothing?: A Little 6 Click Score: 20   End of Session Equipment Utilized During Treatment: Gait belt;Rolling  walker  Activity Tolerance: Patient tolerated treatment well Patient left: in bed;with call bell/phone within reach;with family/visitor present  OT Visit Diagnosis: Unsteadiness on feet (R26.81);Other abnormalities of gait and mobility (R26.89);Pain Pain - Right/Left: Right Pain - part of body: Leg                Time: 2595-6387 OT Time Calculation (min): 31 min Charges:  OT General Charges $OT Visit: 1 Visit OT Evaluation $OT Eval Moderate Complexity: 1 Mod OT Treatments $Self Care/Home Management : 8-22 mins  Nestor Lewandowsky, OTR/L Acute Rehabilitation Services Pager: [phone] Office: [phone]  Malka So 10/07/2018, 11:50 AM

## 2018-10-07 NOTE — Progress Notes (Signed)
PT Cancellation Note  Patient Details Name: Shelia Williams MRN: [id number] DOB: [birth year]   Cancelled Treatment:    Reason Eval/Treat Not Completed: Other (comment).  Pt has just walked with nursing and declined therapy but agreed to let PT come back later.   Ramond Dial 10/07/2018, 8:41 AM   Mee Hives, PT MS Acute Rehab Dept. Number: [place]

## 2018-10-07 NOTE — Discharge Summary (Signed)
Physician Discharge Summary  Shelia Williams WHQ:[id number] DOB: December 24, 1950 DOA: 10/05/2018  PCP: System, Pcp Not In  Admit date: 10/05/2018 Discharge date: 10/07/2018  Admitted From: Home Disposition: Home home  Recommendations for Outpatient Follow-up:  1. Follow up with PCP in 1-2 weeks 2. Please obtain BMP/CBC in one week 3. Follow-up with Dr. Trula Slade  Home Health: None Equipment/Devices: None Discharge Condition stable CODE STATUS: Full code Diet recommendation: Cardiac  Brief/Interim Summary:67 y.o.femalewith medical history significantforPAD, hypertension, lung cancer with mets to brain,who presented to the Ed with c/oof rightlegpain progressively worsened in severity and extended upherthigh to her right lower back and down to her foot,over the past 5 days.No swelling no redness.  2014-patient reports she has had surgeries to both legs for arterial occlusions,[facility].  Patient receives her care in [facility] after her mom passed away in September 08, 2023, she decided to stay here with her daughter to Christmas.  ED Course:Stable vitals, Unremarkable CBC, BMP, Normal lactic acid- 0.98.CT angio AO + BIFEM -complete occlusion of the right femoropopliteal bypass graft with reconstitution of small amount of flow in the distal SFA and popliteal artery.Vascular surgery- Dr. Lissa Morales in the ED recommended hospitalist admission, also questioned if IV heparin could be given Intra-Op tomorrow    Discharge Diagnoses:  Principal Problem:   Right leg pain Active Problems:   COPD (chronic obstructive pulmonary disease) ([place])   Lung cancer metastatic to brain ([place])   PVD (peripheral vascular disease) ([place])  1 complete occlusion of the right lower extremity bypass now status post thrombectomy and stent placement.  Patient will be discharged home on Plavix.  Patient follow-up with Dr. Nechama Guard.  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  Patient follows up at [facility] oncology.  Last MRI of the brain in August 2019 showed redemonstration of multifocal intracranial mets.  2 cerebellar lesions were not appreciably changed in size.  Interval increase in the size of left cerebellar lesion with mild increase in edema.  No significant mass-effect.  Last chest CTA 2019 persistent less well-defined mass in the right middle lobe  3 hypertension soft but stable  4 COPD stable stable      Estimated body mass index is 24.27 kg/m as calculated from the following:   Height as of this encounter: 5\' 2"  (1.575 m).   Weight as of this encounter: 60.2 kg.  Discharge Instructions  Discharge Instructions    Call MD for:  difficulty breathing, headache or visual disturbances   Complete by:  As directed    Call MD for:  persistant dizziness or light-headedness   Complete by:  As directed    Call MD for:  persistant nausea and vomiting   Complete by:  As directed    Call MD for:  temperature >100.4   Complete by:  As directed    Diet - low sodium heart healthy   Complete by:  As directed    Increase activity slowly   Complete by:  As directed      Allergies as of 10/07/2018      Reactions   Penicillins Itching   Has patient had a PCN reaction causing immediate rash, facial/tongue/throat swelling, SOB or lightheadedness with hypotension: No Has patient had a PCN reaction causing severe rash involving mucus membranes or skin necrosis: No Has patient had a PCN reaction that required hospitalization: No Has patient had a PCN reaction occurring within the  last 10 years: No If all of the above answers are "NO", then may proceed with Cephalosporin use.      Medication List    STOP taking these medications   ibuprofen 200 MG tablet Commonly known as:  ADVIL,MOTRIN     TAKE these medications   albuterol 108 (90 Base) MCG/ACT inhaler Commonly known  as:  PROVENTIL HFA;VENTOLIN HFA Inhale 2 puffs into the lungs every 6 (six) hours as needed for wheezing.   albuterol (2.5 MG/3ML) 0.083% nebulizer solution Commonly known as:  PROVENTIL Inhale 2.5 mg into the lungs every 6 (six) hours as needed for wheezing.   aspirin 81 MG chewable tablet Chew 81 mg by mouth daily.   buPROPion 100 MG tablet Commonly known as:  WELLBUTRIN Take 300 mg by mouth daily.   clopidogrel 75 MG tablet Commonly known as:  PLAVIX Take 1 tablet (75 mg total) by mouth daily.   hydrochlorothiazide 12.5 MG capsule Commonly known as:  MICROZIDE Take 12.5 mg by mouth daily.   LORazepam 1 MG tablet Commonly known as:  ATIVAN Take 1 mg by mouth at bedtime.   pantoprazole 40 MG tablet Commonly known as:  PROTONIX Take 1 tablet (40 mg total) by mouth daily. Start taking on:  10/08/2018   polyethylene glycol packet Commonly known as:  MIRALAX / GLYCOLAX Take 17 g by mouth daily as needed for mild constipation.   promethazine 12.5 MG tablet Commonly known as:  PHENERGAN Take 12.5 mg by mouth as needed for dizziness or nausea.   traMADol 50 MG tablet Commonly known as:  ULTRAM Take 50 mg by mouth every 6 (six) hours as needed for pain.   TRELEGY ELLIPTA 100-62.5-25 MCG/INH Aepb Generic drug:  Fluticasone-Umeclidin-Vilant Inhale 1 puff into the lungs daily.      Follow-up Information    [place].   Contact information: [place] [phone]       Serafina Mitchell, MD Follow up in 2 day(s).   Specialties:  Vascular Surgery, Cardiology Why:  office will call Contact information: [address] [place] [address] [phone]          Allergies  Allergen Reactions  . Penicillins Itching    Has patient had a PCN reaction causing immediate rash, facial/tongue/throat swelling, SOB or lightheadedness with hypotension: No Has patient had a PCN reaction causing severe rash  involving mucus membranes or skin necrosis: No Has patient had a PCN reaction that required hospitalization: No Has patient had a PCN reaction occurring within the last 10 years: No If all of the above answers are "NO", then may proceed with Cephalosporin use.     Consultations:  Procedures/Studies: Dg Chest 2 View  Result Date: 10/05/2018 CLINICAL DATA:  Preoperative respiratory evaluation. EXAM: CHEST - 2 VIEW COMPARISON:  12/28/2015 FINDINGS: The lungs are clear without focal pneumonia, edema, pneumothorax or pleural effusion. The cardiopericardial silhouette is within normal limits for size. The visualized bony structures of the thorax are intact. IMPRESSION: No active cardiopulmonary disease. Electronically Signed   By: Misty Stanley M.D.   On: 10/05/2018 23:24   Mr Jeri Cos HQ Contrast  Result Date: 09/07/2018 CLINICAL DATA:  Continued surveillance lung cancer. Radiation therapy completed 3 months ago. EXAM: MRI HEAD WITHOUT AND WITH CONTRAST TECHNIQUE: Multiplanar, multiecho pulse sequences of the brain and surrounding structures were obtained without and with intravenous contrast. CONTRAST:  78mL MULTIHANCE GADOBENATE DIMEGLUMINE 529 MG/ML IV SOLN COMPARISON:  11/11/2016 MR  at [place]: Brain: Interval growth of previously identified LEFT cerebellar metastasis, previous 5 mm, now 13 by 10 x 6 mm. New LEFT posterior temporal metastasis, not identified previously, 7 x 6 x 7 mm. RIGHT posterior frontoparietal craniotomy. RIGHT posterior frontal gyrus enhancing lesion, 10 x 6 x 10 mm, not present previously, consistent with recurrent tumor. Two subcentimeter satellite lesions in the adjacent RIGHT anterior parietal cortex, also not present previously, all of concern for recurrent tumor. Post treatment effect is not excluded, but less favored. Mild vasogenic edema is associated with all lesions, most notably LEFT cerebellum. Small lacunar infarct, posterior lentiform nucleus.  No significant acute or chronic hemorrhage. Vascular: Flow voids are maintained. Skull and upper cervical spine: No worrisome osseous lesion. Sinuses/Orbits: No layering sinus fluid.  Negative orbits. Other: Unremarkable. IMPRESSION: Interval growth of previously identified LEFT cerebellar metastasis, with new LEFT posterior temporal metastasis, RIGHT posterior frontal gyrus recurrence, and two small satellite lesions in the adjacent anterior parietal cortex, all concerning for recurrent tumor. At this time no midline shift or hydrocephalus. Close clinical follow-up recommended. These results will be called to the ordering clinician or representative by the Radiologist Assistant, and communication documented in the PACS or zVision Dashboard. Electronically Signed   By: Staci Righter M.D.   On: 09/07/2018 18:18   Ct Angio Aortobifemoral W And/or Wo Contrast  Result Date: 10/05/2018 CLINICAL DATA:  67 year old female with lower extremity numbness and coldness. EXAM: CT ANGIOGRAPHY OF ABDOMINAL AORTA WITH ILIOFEMORAL RUNOFF TECHNIQUE: Multidetector CT imaging of the abdomen, pelvis and lower extremities was performed using the standard protocol during bolus administration of intravenous contrast. Multiplanar CT image reconstructions and MIPs were obtained to evaluate the vascular anatomy. CONTRAST:  152mL ISOVUE-370 IOPAMIDOL (ISOVUE-370) INJECTION 76% COMPARISON:  None. FINDINGS: VASCULAR Aorta: Advanced calcified and noncalcified plaque of the aorta. There is a large coarse calcification of the abdominal aorta at the level of the renal arteries with associated moderate focal narrowing of the lumen. No aneurysmal dilatation or dissection. Celiac: There is atherosclerotic calcification of the origin of the celiac axis. The celiac artery remains patent. SMA: Patent without evidence of aneurysm, dissection, vasculitis or significant stenosis. Renals: There is atherosclerotic calcification of the ostia of the renal  arteries. The renal arteries remain patent. There is an accessory right renal artery arising from the distal aorta extending to the inferior pole of the right kidney. IMA: Atherosclerotic calcification of the IMA.  The IMA is patent. RIGHT Lower Extremity Inflow: There is advanced atherosclerotic calcification of the common, internal, and external iliac arteries. The external iliac artery is patent. There is diminished flow in the internal iliac artery. Outflow: There is occlusion of the common femoral, and superficial femoral arteries. There is diminished flow in the deep femoral artery. A femoropopliteal bypass graft is seen which is completely occluded. There is reconstitution of some flow in the distal SFA. The distal SFA is moderately atherosclerotic. There is atherosclerotic calcification and luminal narrowing of the popliteal artery with diminished flow. Runoff: There is flow in the fibular artery and anterior tibial artery and dorsalis pedis artery. The posterior tibial artery is occluded and atherosclerotic. LEFT Lower Extremity Inflow: There is advanced atherosclerotic calcification of the common, internal, and external iliac arteries. There is diffuse luminal narrowing of the external iliac artery with diminished flow. This vessel however remains patent. Outflow: There is complete occlusion of the left common femoral artery and SFA. A femoropopliteal bypass graft is noted. There is intraluminal clot  along the periphery of the graft with associated mild luminal narrowing. The graft however remains patent. There is atherosclerotic disease of the distal SFA and popliteal artery with associated luminal narrowing. There is flow within these vessels. Runoff: There is flow in the anterior tibial artery, dorsalis pedis artery, and peroneal artery. The posterior tibial artery is atherosclerotic and occluded. Veins: No obvious venous abnormality within the limitations of this arterial phase study. Review of the MIP  images confirms the above findings. NON-VASCULAR Lower chest: The visualized lung bases are clear. There is coronary vascular calcification. No intra-abdominal free air or free fluid. Hepatobiliary: No focal liver abnormality is seen. No gallstones, gallbladder wall thickening, or biliary dilatation. Pancreas: Unremarkable. No pancreatic ductal dilatation or surrounding inflammatory changes. Spleen: Normal in size without focal abnormality. Adrenals/Urinary Tract: Adrenal glands are unremarkable. Kidneys are normal, without renal calculi, focal lesion, or hydronephrosis. Bladder is unremarkable. Stomach/Bowel: There is no bowel obstruction or active inflammation. There is a 3 cm distal duodenal diverticulum. Moderate stool throughout the colon. The appendix is normal. Lymphatic: No adenopathy. Reproductive: Hysterectomy.  No pelvic mass. Other: None Musculoskeletal: Disc desiccation at L5-S1. No acute osseous pathology. IMPRESSION: VASCULAR CLINICAL DATA:  67 year old female with lower extremity numbness and coldness. EXAM: CT ANGIOGRAPHY OF ABDOMINAL AORTA WITH ILIOFEMORAL RUNOFF TECHNIQUE: Multidetector CT imaging of the abdomen, pelvis and lower extremities was performed using the standard protocol during bolus administration of intravenous contrast. Multiplanar CT image reconstructions and MIPs were obtained to evaluate the vascular anatomy. CONTRAST:  156mL ISOVUE-370 IOPAMIDOL (ISOVUE-370) INJECTION 76% COMPARISON:  None. FINDINGS: VASCULAR Aorta: Advanced calcified and noncalcified plaque of the aorta. There is a large coarse calcification of the abdominal aorta at the level of the renal arteries with associated moderate focal narrowing of the lumen. No aneurysmal dilatation or dissection. Celiac: There is atherosclerotic calcification of the origin of the celiac axis. The celiac artery remains patent. SMA: Patent without evidence of aneurysm, dissection, vasculitis or significant stenosis. Renals: There is  atherosclerotic calcification of the ostia of the renal arteries. The renal arteries remain patent. There is an accessory right renal artery arising from the distal aorta extending to the inferior pole of the right kidney. IMA: Atherosclerotic calcification of the IMA. The IMA is patent. RIGHT Lower Extremity Inflow: There is advanced atherosclerotic calcification of the common, internal, and external iliac arteries. The external iliac artery is patent. There is diminished flow in the internal iliac artery. Outflow: There is occlusion of the common femoral, and superficial femoral arteries. There is diminished flow in the deep femoral artery. A femoropopliteal bypass graft is seen which is completely occluded. There is reconstitution of some flow in the distal SFA. The distal SFA is moderately atherosclerotic. There is atherosclerotic calcification and luminal narrowing of the popliteal artery with diminished flow. Runoff: There is flow in the fibular artery and anterior tibial artery and dorsalis pedis artery. The posterior tibial artery is occluded and atherosclerotic. LEFT Lower Extremity Inflow: There is advanced atherosclerotic calcification of the common, internal, and external iliac arteries. There is diffuse luminal narrowing of the external iliac artery with diminished flow. This vessel however remains patent. Outflow: There is complete occlusion of the left common femoral artery and SFA. A femoropopliteal bypass graft is noted. There is intraluminal clot along the periphery of the graft with associated mild luminal narrowing. The graft however remains patent. There is atherosclerotic disease of the distal SFA and popliteal artery with associated luminal narrowing. There is flow within these vessels.  Runoff: There is flow in the anterior tibial artery, dorsalis pedis artery, and peroneal artery. The posterior tibial artery is atherosclerotic and occluded. Veins: No obvious venous abnormality within the  limitations of this arterial phase study. Review of the MIP images confirms the above findings. NON-VASCULAR Lower chest: The visualized lung bases are clear. There is coronary vascular calcification. No intra-abdominal free air or free fluid. Hepatobiliary: No focal liver abnormality is seen. No gallstones, gallbladder wall thickening, or biliary dilatation. Pancreas: Unremarkable. No pancreatic ductal dilatation or surrounding inflammatory changes. Spleen: Normal in size without focal abnormality. Adrenals/Urinary Tract: Adrenal glands are unremarkable. Kidneys are normal, without renal calculi, focal lesion, or hydronephrosis. Bladder is unremarkable. Stomach/Bowel: There is no bowel obstruction or active inflammation. There is a 3 cm distal duodenal diverticulum. Moderate stool throughout the colon. The appendix is normal. Lymphatic: No adenopathy. Reproductive: Hysterectomy. No pelvic mass. Other: None Musculoskeletal: Disc desiccation at L5-S1. No acute osseous pathology. IMPRESSION: VASCULAR 1. Complete occlusion of the right femoropopliteal bypass graft with reconstitution of small amount of flow in the distal SFA and popliteal artery. 2. Luminal narrowing of the left femoropopliteal bypass graft. The graft remains patent. 3. Patent appearance of the anterior tibial arteries and peroneal arteries bilaterally. Occluded peroneal arteries. 4. Advanced atherosclerotic calcification of the abdominal aorta. NON-VASCULAR No acute intra-abdominal or pelvic pathology. : NON-VASCULAR Electronically Signed   By: Anner Crete M.D.   On: 10/05/2018 22:24    (Echo, Carotid, EGD, Colonoscopy, ERCP)    Subjective:   Discharge Exam: Vitals:   10/07/18 0400 10/07/18 0758  BP: (!) 113/54   Pulse: 85   Resp: 16   Temp: 98.8 F (37.1 C)   SpO2: 98% 96%   Vitals:   10/06/18 1915 10/07/18 0000 10/07/18 0400 10/07/18 0758  BP: (!) 125/57 (!) 132/57 (!) 113/54   Pulse: 88 91 85   Resp: 20 20 16    Temp:  97.9 F (36.6 C) 98.4 F (36.9 C) 98.8 F (37.1 C)   TempSrc: Oral Oral Oral   SpO2: 96% 99% 98% 96%  Weight:      Height:        General: Pt is alert, awake, not in acute distress Cardiovascular: RRR, S1/S2 +, no rubs, no gallops Respiratory: CTA bilaterally, no wheezing, no rhonchi Abdominal: Soft, NT, ND, bowel sounds + Extremities: no edema, no cyanosis    The results of significant diagnostics from this hospitalization (including imaging, microbiology, ancillary and laboratory) are listed below for reference.     Microbiology: Recent Results (from the past 240 hour(s))  Surgical pcr screen     Status: None   Collection Time: 10/05/18 11:48 PM  Result Value Ref Range Status   MRSA, PCR NEGATIVE NEGATIVE Final   Staphylococcus aureus NEGATIVE NEGATIVE Final    Comment: (NOTE) The Xpert SA Assay (FDA approved for NASAL specimens in patients 53 years of age and older), is one component of a comprehensive surveillance program. It is not intended to diagnose infection nor to guide or monitor treatment. Performed at [facility], [place] [address] [place], [place] [address]      Labs: BNP (last 3 results) No results for input(s): BNP in the last 8760 hours. Basic Metabolic Panel: Recent Labs  Lab 10/05/18 1847 10/06/18 0351 10/07/18 0356  NA 138 139 139  K 3.7 4.0 3.9  CL 102 101 102  CO2  --  27 31  GLUCOSE 89 109* 111*  BUN 14 8 6*  CREATININE 0.80 0.85 0.90  CALCIUM  --  8.9 8.2*   Liver Function Tests: No results for input(s): AST, ALT, ALKPHOS, BILITOT, PROT, ALBUMIN in the last 168 hours. No results for input(s): LIPASE, AMYLASE in the last 168 hours. No results for input(s): AMMONIA in the last 168 hours. CBC: Recent Labs  Lab 10/05/18 1835 10/05/18 1847 10/06/18 0351 10/07/18 0356  WBC 9.8  --  7.5 7.2  NEUTROABS 5.5  --   --   --   HGB 14.0 15.0 13.4 9.5*  HCT 44.5 44.0 43.7 31.4*  MCV 91.8  --  91.8 94.0  PLT 218  --  217 174    Cardiac Enzymes: No results for input(s): CKTOTAL, CKMB, CKMBINDEX, TROPONINI in the last 168 hours. BNP: Invalid input(s): POCBNP CBG: No results for input(s): GLUCAP in the last 168 hours. D-Dimer No results for input(s): DDIMER in the last 72 hours. Hgb A1c No results for input(s): HGBA1C in the last 72 hours. Lipid Profile No results for input(s): CHOL, HDL, LDLCALC, TRIG, CHOLHDL, LDLDIRECT in the last 72 hours. Thyroid function studies No results for input(s): TSH, T4TOTAL, T3FREE, THYROIDAB in the last 72 hours.  Invalid input(s): FREET3 Anemia work up No results for input(s): VITAMINB12, FOLATE, FERRITIN, TIBC, IRON, RETICCTPCT in the last 72 hours. Urinalysis No results found for: COLORURINE, APPEARANCEUR, [place], [place], [place], [place], [place], [place], PROTEINUR, UROBILINOGEN, NITRITE, LEUKOCYTESUR Sepsis Labs Invalid input(s): PROCALCITONIN,  WBC,  LACTICIDVEN Microbiology Recent Results (from the past 240 hour(s))  Surgical pcr screen     Status: None   Collection Time: 10/05/18 11:48 PM  Result Value Ref Range Status   MRSA, PCR NEGATIVE NEGATIVE Final   Staphylococcus aureus NEGATIVE NEGATIVE Final    Comment: (NOTE) The Xpert SA Assay (FDA approved for NASAL specimens in patients 2 years of age and older), is one component of a comprehensive surveillance program. It is not intended to diagnose infection nor to guide or monitor treatment. Performed at [facility], [place] [address], [place], [place] [address]      Time coordinating discharge:35 minutes  SIGNED:   Georgette Shell, MD  [facility] 10/07/2018, 1:49 PM Pager   If 7PM-7AM, please contact night-coverage [email or web address] Password [email or web address]

## 2018-10-08 NOTE — Progress Notes (Signed)
Reviewed AVS discharge instructions with patient/caregiver. Patient/caregiver verbalizes understanding of instructions received. AVS and prescriptions received by patient/caregiver. If present, telemetry box removed and central cardiac monitoring department notified of discharge. Peripheral IV removed, site benign with tip intact.  

## 2018-10-08 NOTE — Care Management Note (Signed)
Case Management Note Marvetta Gibbons RN, BSN Transitions of Care Unit 4E- RN Case Manager [phone]  Patient Details  Name: Shelia Williams MRN: [id number] Date of Birth: [birth year]  Subjective/Objective:     Pt admitted s/p Thrombectomy RLE bypass with popliteal stent, post procedure pain control issues.               Action/Plan: PTA pt lived at home, plan to stay with daughter here in town, medications filled by [facility] and have been delivered to bedside. No CM needs noted for transition home with daughter.   Expected Discharge Date:  10/07/18               Expected Discharge Plan:  Home/Self Care  In-House Referral:  NA  Discharge planning Services  CM Consult  Post Acute Care Choice:  NA Choice offered to:  NA  DME Arranged:    DME Agency:     HH Arranged:    HH Agency:     Status of Service:  Completed, signed off  If discussed at [place] of Stay Meetings, dates discussed:    Discharge Disposition: home/self care   Additional Comments:  Dawayne Patricia, RN 10/08/2018, 10:45 AM

## 2018-10-08 NOTE — Progress Notes (Signed)
Occupational Therapy Treatment Patient Details Name: Shelia Williams MRN: [id number] DOB: 08/07/1951 Today's Date: 10/08/2018    History of present illness s/p R femoral artery thrombectomy with redo of R fem-pop BPG, R popliteal to tibial artery stent. PMH: lung ca with brain mets, smoker, COPD, HTN.   OT comments  Pt making good progress with functional goals. Pt reports improved pain in R LE to 3/10. Pt ambulated to bathroom with RW for toileting tasks with sup, transfer to walk in shower with sup using grab bars and seat, stood at sink for grooming and hygiene with set up/sup. Pt reports that she may d/c to her dauhgter's home this afternoon  Follow Up Recommendations  No OT follow up    Equipment Recommendations  None recommended by OT    Recommendations for Other Services      Precautions / Restrictions Precautions Precautions: Fall Restrictions Weight Bearing Restrictions: No       Mobility Bed Mobility Overal bed mobility: Modified Independent Bed Mobility: Supine to Sit;Sit to Supine     Supine to sit: Modified independent (Device/Increase time) Sit to supine: Modified independent (Device/Increase time)      Transfers Overall transfer level: Needs assistance Equipment used: 1 person hand held assist;Rolling walker (2 wheeled) Transfers: Sit to/from [facility] Sit to Stand: Supervision Stand pivot transfers: Supervision       General transfer comment: pt ambulated to bathroom with RW and from with HHA    Balance Overall balance assessment: Needs assistance Sitting-balance support: Feet supported Sitting balance-Leahy Scale: Good     Standing balance support: Single extremity supported;Bilateral upper extremity supported;During functional activity Standing balance-Leahy Scale: Fair                             ADL either performed or assessed with clinical judgement   ADL Overall ADL's : Needs assistance/impaired      Grooming: Wash/dry hands;Standing;Wash/dry face;Brushing hair;Supervision/safety;Set up       Lower Body Bathing: Sit to/from stand;Min guard       Lower Body Dressing: Sit to/from stand;Min guard   Toilet Transfer: Supervision/safety;Ambulation;RW;Comfort height toilet;Grab bars   Toileting- Clothing Manipulation and Hygiene: Supervision/safety;Sitting/lateral lean   Tub/ Shower Transfer: Supervision/safety;Ambulation;Grab bars;Shower seat   Functional mobility during ADLs: Supervision/safety       Vision Patient Visual Report: No change from baseline     Perception     Praxis      Cognition Arousal/Alertness: Awake/alert Behavior During Therapy: WFL for tasks assessed/performed Overall Cognitive Status: Within Functional Limits for tasks assessed                                          Exercises     Shoulder Instructions       General Comments      Pertinent Vitals/ Pain       Pain Assessment: 0-10 Pain Score: 3  Pain Location: R LE Pain Descriptors / Indicators: Sore Pain Intervention(s): Monitored during session;Repositioned  Home Living                                          Prior Functioning/Environment              Frequency  Min 2X/week  Progress Toward Goals  OT Goals(current goals can now be found in the care plan section)  Progress towards OT goals: Progressing toward goals  Acute Rehab OT Goals Patient Stated Goal: to return to [facility]  Plan Discharge plan remains appropriate    Co-evaluation                 AM-PAC OT "6 Clicks" Daily Activity     Outcome Measure   Help from another person eating meals?: None Help from another person taking care of personal grooming?: A Little   Help from another person bathing (including washing, rinsing, drying)?: A Little Help from another person to put on and taking off regular upper body clothing?: None Help from another person to  put on and taking off regular lower body clothing?: A Little 6 Click Score: 17    End of Session Equipment Utilized During Treatment: Gait belt;Rolling walker  OT Visit Diagnosis: Unsteadiness on feet (R26.81);Other abnormalities of gait and mobility (R26.89);Pain Pain - Right/Left: Right Pain - part of body: Leg   Activity Tolerance Patient tolerated treatment well   Patient Left in bed;with call bell/phone within reach   Nurse Communication          Time: 7902-4097 OT Time Calculation (min): 16 min  Charges: OT General Charges $OT Visit: 1 Visit OT Treatments $Self Care/Home Management : 8-22 mins     Emmit [facility]ers [facility] 10/08/2018, 10:42 AM

## 2018-10-08 NOTE — Evaluation (Signed)
Physical Therapy Evaluation Patient Details Name: Shelia Williams MRN: [id number] DOB: [birth year] Today's Date: 10/08/2018   History of Present Illness  s/p R femoral artery thrombectomy with redo of R fem-pop BPG, R popliteal to tibial artery stent. PMH: lung ca with brain mets, smoker, COPD, HTN.  Clinical Impression  Pt doing well with mobility and no further PT needed.  Ready for dc from PT standpoint.      Follow Up Recommendations No PT follow up;Supervision - Intermittent    Equipment Recommendations  None recommended by PT    Recommendations for Other Services       Precautions / Restrictions Precautions Precautions: None Restrictions Weight Bearing Restrictions: No      Mobility  Bed Mobility Overal bed mobility: Modified Independent Bed Mobility: Supine to Sit;Sit to Supine     Supine to sit: Modified independent (Device/Increase time) Sit to supine: Modified independent (Device/Increase time)      Transfers Overall transfer level: Needs assistance Equipment used: Rolling walker (2 wheeled);None Transfers: Sit to/from Stand Sit to Stand: Supervision Stand pivot transfers: Supervision       General transfer comment: supervision for lines/safety  Ambulation/Gait Ambulation/Gait assistance: Supervision;Min guard Gait Distance (Feet): 225 Feet Assistive device: None;4-wheeled walker Gait Pattern/deviations: Step-through pattern;Decreased stride length;Trunk flexed Gait velocity: decr Gait velocity interpretation: 1.31 - 2.62 ft/sec, indicative of limited community ambulator General Gait Details: min guard without assistive device. supervision with walker  Stairs            Wheelchair Mobility    Modified Rankin (Stroke Patients Only)       Balance Overall balance assessment: Mild deficits observed, not formally tested Sitting-balance support: Feet supported Sitting balance-Leahy Scale: Good     Standing balance support: Single  extremity supported;Bilateral upper extremity supported;During functional activity Standing balance-Leahy Scale: Fair                               Pertinent Vitals/Pain Pain Assessment: 0-10 Pain Score: 4  Pain Location: R LE Pain Descriptors / Indicators: Sore Pain Intervention(s): Monitored during session    Home Living Family/patient expects to be discharged to:: Private residence Living Arrangements: Children Available Help at Discharge: Family;Available 24 hours/day Type of Home: House Home Access: Stairs to enter Entrance Stairs-Rails: Right Entrance Stairs-Number of Steps: 3 Home Layout: One level Home Equipment: Walker - 4 wheels;Bedside commode;Shower seat;Adaptive equipment Additional Comments: equipment was her mother's who recently died    Prior Function Level of Independence: Independent         Comments: pt will stay with her daughter until she recovers, pt is from [place]: Right    Extremity/Trunk Assessment   Upper Extremity Assessment Upper Extremity Assessment: Defer to OT evaluation    Lower Extremity Assessment Lower Extremity Assessment: RLE deficits/detail RLE Deficits / Details: limited by pain       Communication   Communication: No difficulties  Cognition Arousal/Alertness: Awake/alert Behavior During Therapy: WFL for tasks assessed/performed Overall Cognitive Status: Within Functional Limits for tasks assessed                                        General Comments      Exercises     Assessment/Plan    PT Assessment Patent does not need any further  PT services  PT Problem List         PT Treatment Interventions      PT Goals (Current goals can be found in the Care Plan section)  Acute Rehab PT Goals Patient Stated Goal: to return to PLOF PT Goal Formulation: All assessment and education complete, DC therapy    Frequency     Barriers to discharge         Co-evaluation               AM-PAC PT "6 Clicks" Mobility  Outcome Measure Help needed turning from your back to your side while in a flat bed without using bedrails?: None Help needed moving from lying on your back to sitting on the side of a flat bed without using bedrails?: None Help needed moving to and from a bed to a chair (including a wheelchair)?: A Little Help needed standing up from a chair using your arms (e.g., wheelchair or bedside chair)?: A Little Help needed to walk in hospital room?: A Little Help needed climbing 3-5 steps with a railing? : A Little 6 Click Score: 20    End of Session   Activity Tolerance: Patient tolerated treatment well Patient left: in bed;with call bell/phone within reach Nurse Communication: Mobility status PT Visit Diagnosis: Other abnormalities of gait and mobility (R26.89);Pain Pain - Right/Left: Right Pain - part of body: Leg    Time: 0910-0922 PT Time Calculation (min) (ACUTE ONLY): 12 min   Charges:   PT Evaluation $PT Eval Low Complexity: [place] Pager [phone] Office [place] 10/08/2018, 1:39 PM

## 2018-10-08 NOTE — Progress Notes (Signed)
  Progress Note    10/08/2018 9:21 AM 2 Days Post-Op  Subjective:  R groin pain delayed discharge yesterday.  Patient says groin feels much better today.   Vitals:   10/08/18 0739 10/08/18 0837  BP:  (!) 110/59  Pulse:  91  Resp:  20  Temp:  (!) 97.3 F (36.3 C)  SpO2: 96% 90%   Physical Exam: Lungs:  Non labored Incisions:  R groin incision soft, no bleeding or erythema, some ecchymosis Extremities:  Palpable R ATA Abdomen:  Soft Neurologic: A&O  CBC    Component Value Date/Time   WBC 7.2 10/07/2018 0356   RBC 3.34 (L) 10/07/2018 0356   HGB 9.5 (L) 10/07/2018 0356   HCT 31.4 (L) 10/07/2018 0356   PLT 174 10/07/2018 0356   MCV 94.0 10/07/2018 0356   MCH 28.4 10/07/2018 0356   MCHC 30.3 10/07/2018 0356   RDW 13.7 10/07/2018 0356   LYMPHSABS 3.4 10/05/2018 1835   MONOABS 0.5 10/05/2018 1835   EOSABS 0.3 10/05/2018 1835   BASOSABS 0.1 10/05/2018 1835    BMET    Component Value Date/Time   NA 139 10/07/2018 0356   K 3.9 10/07/2018 0356   CL 102 10/07/2018 0356   CO2 31 10/07/2018 0356   GLUCOSE 111 (H) 10/07/2018 0356   BUN 6 (L) 10/07/2018 0356   CREATININE 0.90 10/07/2018 0356   CALCIUM 8.2 (L) 10/07/2018 0356   GFRNONAA >60 10/07/2018 0356   GFRAA >60 10/07/2018 0356    INR    Component Value Date/Time   INR 0.91 10/06/2018 0351     Intake/Output Summary (Last 24 hours) at 10/08/2018 0921 Last data filed at 10/08/2018 0432 Gross per 24 hour  Intake 240 ml  Output 2600 ml  Net -2360 ml     Assessment/Plan:  67 y.o. female is s/p Thrombectomy RLE bypass with popliteal stent 2 Days Post-Op   Palpable R ATA pulse R groin incision unremarkable Ok to discharge patient without ultrasound this afternoon if pain controlled Office follow up scheduled for 11/04/18 with Dr. Trula Slade Prescription for plavix on paper chart   Dagoberto Ligas, PA-C [facility] [phone] 10/08/2018 9:21 AM

## 2018-10-15 ENCOUNTER — Observation Stay (HOSPITAL_COMMUNITY)
Admission: EM | Admit: 2018-10-15 | Discharge: 2018-10-16 | Disposition: A | Discharge status: Home / Self Care | Payer: Medicare PPO | Attending: Internal Medicine | Admitting: Internal Medicine

## 2018-10-15 ENCOUNTER — Encounter (HOSPITAL_COMMUNITY): Payer: Self-pay | Admitting: Emergency Medicine

## 2018-10-15 ENCOUNTER — Other Ambulatory Visit: Payer: Self-pay

## 2018-10-15 ENCOUNTER — Emergency Department (HOSPITAL_COMMUNITY): Payer: Medicare PPO

## 2018-10-15 DIAGNOSIS — Z85118 Personal history of other malignant neoplasm of bronchus and lung: Secondary | ICD-10-CM | POA: Insufficient documentation

## 2018-10-15 DIAGNOSIS — Z79899 Other long term (current) drug therapy: Secondary | ICD-10-CM | POA: Insufficient documentation

## 2018-10-15 DIAGNOSIS — Z7902 Long term (current) use of antithrombotics/antiplatelets: Secondary | ICD-10-CM | POA: Diagnosis not present

## 2018-10-15 DIAGNOSIS — J440 Chronic obstructive pulmonary disease with acute lower respiratory infection: Secondary | ICD-10-CM | POA: Diagnosis not present

## 2018-10-15 DIAGNOSIS — J209 Acute bronchitis, unspecified: Secondary | ICD-10-CM | POA: Diagnosis not present

## 2018-10-15 DIAGNOSIS — D649 Anemia, unspecified: Secondary | ICD-10-CM | POA: Diagnosis not present

## 2018-10-15 DIAGNOSIS — J9601 Acute respiratory failure with hypoxia: Secondary | ICD-10-CM | POA: Diagnosis not present

## 2018-10-15 DIAGNOSIS — Z7982 Long term (current) use of aspirin: Secondary | ICD-10-CM | POA: Diagnosis not present

## 2018-10-15 DIAGNOSIS — C7931 Secondary malignant neoplasm of brain: Secondary | ICD-10-CM | POA: Insufficient documentation

## 2018-10-15 DIAGNOSIS — I10 Essential (primary) hypertension: Secondary | ICD-10-CM | POA: Diagnosis not present

## 2018-10-15 DIAGNOSIS — C3490 Malignant neoplasm of unspecified part of unspecified bronchus or lung: Secondary | ICD-10-CM | POA: Diagnosis not present

## 2018-10-15 DIAGNOSIS — I2510 Atherosclerotic heart disease of native coronary artery without angina pectoris: Secondary | ICD-10-CM | POA: Diagnosis not present

## 2018-10-15 DIAGNOSIS — J441 Chronic obstructive pulmonary disease with (acute) exacerbation: Secondary | ICD-10-CM

## 2018-10-15 DIAGNOSIS — I739 Peripheral vascular disease, unspecified: Secondary | ICD-10-CM | POA: Diagnosis not present

## 2018-10-15 DIAGNOSIS — F17210 Nicotine dependence, cigarettes, uncomplicated: Secondary | ICD-10-CM | POA: Diagnosis not present

## 2018-10-15 DIAGNOSIS — I700 Atherosclerosis of aorta: Secondary | ICD-10-CM | POA: Insufficient documentation

## 2018-10-15 LAB — CBC WITH DIFFERENTIAL/PLATELET
ABS IMMATURE GRANULOCYTES: 0.03 10*3/uL (ref 0.00–0.07)
Basophils Absolute: 0 10*3/uL (ref 0.0–0.1)
Basophils Relative: 1 %
Eosinophils Absolute: 0.2 10*3/uL (ref 0.0–0.5)
Eosinophils Relative: 3 %
HCT: 32.8 % — ABNORMAL LOW (ref 36.0–46.0)
Hemoglobin: 10.2 g/dL — ABNORMAL LOW (ref 12.0–15.0)
Immature Granulocytes: 0 %
Lymphocytes Relative: 24 %
Lymphs Abs: 1.8 10*3/uL (ref 0.7–4.0)
MCH: 28.7 pg (ref 26.0–34.0)
MCHC: 31.1 g/dL (ref 30.0–36.0)
MCV: 92.4 fL (ref 80.0–100.0)
Monocytes Absolute: 0.5 10*3/uL (ref 0.1–1.0)
Monocytes Relative: 7 %
Neutro Abs: 5 10*3/uL (ref 1.7–7.7)
Neutrophils Relative %: 65 %
PLATELETS: 329 10*3/uL (ref 150–400)
RBC: 3.55 MIL/uL — ABNORMAL LOW (ref 3.87–5.11)
RDW: 13.8 % (ref 11.5–15.5)
WBC: 7.6 10*3/uL (ref 4.0–10.5)
nRBC: 0 % (ref 0.0–0.2)

## 2018-10-15 LAB — BASIC METABOLIC PANEL
Anion gap: 13 (ref 5–15)
BUN: 8 mg/dL (ref 8–23)
CO2: 24 mmol/L (ref 22–32)
Calcium: 9.1 mg/dL (ref 8.9–10.3)
Chloride: 99 mmol/L (ref 98–111)
Creatinine, Ser: 1 mg/dL (ref 0.44–1.00)
GFR calc Af Amer: >60 mL/min (ref >60)
GFR calc non Af Amer: 58 mL/min — ABNORMAL LOW (ref >60)
Glucose, Bld: 118 mg/dL — ABNORMAL HIGH (ref 70–99)
POTASSIUM: 3.2 mmol/L — AB (ref 3.5–5.1)
SODIUM: 136 mmol/L (ref 135–145)

## 2018-10-15 LAB — TROPONIN I

## 2018-10-15 MED ORDER — METHYLPREDNISOLONE SODIUM SUCC 125 MG IJ SOLR
125.0000 mg | Freq: Once | INTRAMUSCULAR | Status: DC
  Filled 2018-10-15: qty 2

## 2018-10-15 MED ORDER — POTASSIUM CHLORIDE CRYS ER 20 MEQ PO TBCR
20.0000 meq | EXTENDED_RELEASE_TABLET | Freq: Once | ORAL | Status: AC
  Administered 2018-10-16: 20 meq via ORAL
  Filled 2018-10-15: qty 1

## 2018-10-15 MED ORDER — ALBUTEROL SULFATE (2.5 MG/3ML) 0.083% IN NEBU
5.0000 mg | INHALATION_SOLUTION | Freq: Once | RESPIRATORY_TRACT | Status: AC
  Administered 2018-10-15: 5 mg via RESPIRATORY_TRACT
  Filled 2018-10-15: qty 6

## 2018-10-15 MED ORDER — PREDNISONE 20 MG PO TABS
60.0000 mg | ORAL_TABLET | Freq: Once | ORAL | Status: AC
  Administered 2018-10-15: 60 mg via ORAL
  Filled 2018-10-15: qty 3

## 2018-10-15 NOTE — ED Notes (Signed)
Ambulated pt. To the restroom. Sats dropped from 100 to 93% off O2. Upon returning to room and reconnecting to O2 sats returned to 100% @2L . Pt. Complaining of "slight SOB" but no dizziness.

## 2018-10-15 NOTE — ED Provider Notes (Signed)
[facility] EMERGENCY DEPARTMENT Provider Note   CSN: [id number] Arrival date & time: 10/15/18  2103     History   Chief Complaint Chief Complaint  Patient presents with  . Shortness of Breath  . Cough    HPI Shelia Williams is a 67 y.o. female.  The history is provided by the patient and a caregiver.  Shortness of Breath  This is a recurrent problem. The problem occurs continuously.The current episode started more than 2 days ago. The problem has been gradually worsening. Associated symptoms include cough, sputum production and wheezing. Pertinent negatives include no fever, no headaches, no sore throat, no swollen glands, no ear pain, no PND, no orthopnea, no chest pain, no syncope, no vomiting, no abdominal pain, no rash and no leg swelling. It is unknown what precipitated the problem. Risk factors include smoking. She has tried ipratropium inhalers for the symptoms. The treatment provided mild relief. She has had prior hospitalizations. She has had prior ED visits. Associated medical issues include COPD. Associated medical issues do not include PE or DVT. Associated medical issues comments: PVD, recent sugery on plavix.    Past Medical History:  Diagnosis Date  . Cancer ([place])   . Hypertension   . Lung cancer [facility])     Patient Active Problem List   Diagnosis Date Noted  . COPD with acute bronchitis ([place]) 10/15/2018  . Right leg pain 10/05/2018  . COPD (chronic obstructive pulmonary disease) ([place]) 10/05/2018  . Lung cancer metastatic to brain ([place]) 10/05/2018  . PVD (peripheral vascular disease) ([place]) 10/05/2018    Past Surgical History:  Procedure Laterality Date  . ABDOMINAL HYSTERECTOMY    . arm surgery    . BRAIN SURGERY    . INSERTION OF ILIAC STENT Right 10/06/2018   Procedure: INSERTION OF STENT IN RIGHT FEMORAL POPLITEAL GRAFT, INSERTION OF STENT IN RIGHT POPLITEAL TO TIBIAL ARTERY;  Surgeon: Serafina Mitchell, MD;  Location: [place];  Service:  Vascular;  Laterality: Right;  . INTRAOPERATIVE ARTERIOGRAM  10/06/2018   Procedure: INTRA OPERATIVE ARTERIOGRAM WITH RIGHT LEG RUNOFF;  Surgeon: Serafina Mitchell, MD;  Location: [facility] OR;  Service: Vascular;;  . PATCH ANGIOPLASTY Right 10/06/2018   Procedure: PATCH ANGIOPLASTY RIGHT FEMORAL ARTERY;  Surgeon: Serafina Mitchell, MD;  Location: [place];  Service: Vascular;  Laterality: Right;  . THROMBECTOMY FEMORAL ARTERY Right 10/06/2018   Procedure: RIGHT FEMORAL ARTERY THROMBECTOMY;  Surgeon: Serafina Mitchell, MD;  Location: [place];  Service: Vascular;  Laterality: Right;  . VEIN SURGERY       OB History   No obstetric history on file.      Home Medications    Prior to Admission medications   Medication Sig Start Date End Date Taking? Authorizing Provider  albuterol (PROVENTIL HFA;VENTOLIN HFA) 108 (90 Base) MCG/ACT inhaler Inhale 2 puffs into the lungs every 6 (six) hours as needed for wheezing. 10/06/14  Yes [provider]  albuterol (PROVENTIL) (2.5 MG/3ML) 0.083% nebulizer solution Inhale 2.5 mg into the lungs every 6 (six) hours as needed for wheezing. 01/06/16  Yes [provider]  aspirin 81 MG chewable tablet Chew 81 mg by mouth daily.   Yes [provider]  buPROPion (WELLBUTRIN) 100 MG tablet Take 300 mg by mouth daily. 09/20/18  Yes [provider]  clopidogrel (PLAVIX) 75 MG tablet Take 1 tablet (75 mg total) by mouth daily. 10/07/18  Yes Georgette Shell, MD  hydrochlorothiazide (MICROZIDE) 12.5 MG capsule Take 12.5  mg by mouth daily. 09/20/18  Yes [provider]  LORazepam (ATIVAN) 1 MG tablet Take 1 mg by mouth at bedtime. 09/06/18  Yes [provider]  pantoprazole (PROTONIX) 40 MG tablet Take 1 tablet (40 mg total) by mouth daily. 10/08/18  Yes Georgette Shell, MD  polyethylene glycol [employer] Floria Raveling) packet Take 17 g by mouth daily as needed for mild constipation. 10/07/18  Yes Georgette Shell, MD  promethazine  (PHENERGAN) 12.5 MG tablet Take 12.5 mg by mouth as needed for dizziness or nausea.   Yes [provider]  traMADol (ULTRAM) 50 MG tablet Take 50 mg by mouth every 6 (six) hours as needed for pain.   Yes [provider]  TRELEGY ELLIPTA 100-62.5-25 MCG/INH AEPB Inhale 1 puff into the lungs daily. 07/05/18  Yes [provider]    Family History Family History  Problem Relation Age of Onset  . Diabetes Mellitus II Sister     Social History Social History   Tobacco Use  . Smoking status: Current Every Day Smoker    Types: Cigarettes  . Smokeless tobacco: Never Used  Substance Use Topics  . Alcohol use: Not Currently  . Drug use: Never     Allergies   Penicillins   Review of Systems Review of Systems  Constitutional: Negative for chills and fever.  HENT: Negative for ear pain and sore throat.   Eyes: Negative for pain and visual disturbance.  Respiratory: Positive for cough, sputum production, shortness of breath and wheezing.   Cardiovascular: Negative for chest pain, palpitations, orthopnea, leg swelling, syncope and PND.  Gastrointestinal: Negative for abdominal pain and vomiting.  Genitourinary: Negative for dysuria and hematuria.  Musculoskeletal: Negative for arthralgias and back pain.  Skin: Negative for color change and rash.  Neurological: Negative for seizures, syncope and headaches.  All other systems reviewed and are negative.    Physical Exam Updated Vital Signs BP 133/77   Pulse 89   Resp 20   SpO2 96%   Physical Exam Vitals signs and nursing note reviewed.  Constitutional:      General: She is not in acute distress.    Appearance: She is well-developed.  HENT:     Head: Normocephalic and atraumatic.  Eyes:     Conjunctiva/sclera: Conjunctivae normal.     Pupils: Pupils are equal, round, and reactive to light.  Neck:     Musculoskeletal: Normal range of motion and neck supple.  Cardiovascular:     Rate and Rhythm:  Normal rate and regular rhythm.     Heart sounds: No murmur.  Pulmonary:     Effort: Tachypnea present. No respiratory distress.     Breath sounds: Decreased breath sounds and wheezing present. No rhonchi or rales.  Abdominal:     Palpations: Abdomen is soft.     Tenderness: There is no abdominal tenderness.  Musculoskeletal: Normal range of motion.     Right lower leg: No edema.     Left lower leg: No edema.  Skin:    General: Skin is warm and dry.     Capillary Refill: Capillary refill takes less than 2 seconds.  Neurological:     General: No focal deficit present.     Mental Status: She is alert.  Psychiatric:        Mood and Affect: Mood normal.      ED Treatments / Results  Labs (all labs ordered are listed, but only abnormal results are displayed) Labs Reviewed  CBC WITH DIFFERENTIAL/PLATELET - Abnormal; Notable for the following components:      Result Value   RBC 3.55 (*)    Hemoglobin 10.2 (*)    HCT 32.8 (*)    All other components within normal limits  BASIC METABOLIC PANEL - Abnormal; Notable for the following components:   Potassium 3.2 (*)    Glucose, Bld 118 (*)    GFR calc non Af Amer 58 (*)    All other components within normal limits  TROPONIN I  D-DIMER, QUANTITATIVE (NOT AT [facility])  BRAIN NATRIURETIC PEPTIDE  HEPATIC FUNCTION PANEL  MAGNESIUM  FERRITIN  IRON AND TIBC  TYPE AND SCREEN    EKG EKG Interpretation  Date/Time:  Tuesday October 15 2018 21:06:39 EST Ventricular Rate:  93 PR Interval:    QRS Duration: 94 QT Interval:  345 QTC Calculation: 430 R Axis:   117 Text Interpretation:  Sinus or ectopic atrial rhythm Probable left atrial enlargement Right axis deviation Low voltage, precordial leads Abnormal T, consider ischemia, lateral leads Confirmed by Lennice Sites [phone]) on 10/15/2018 9:11:58 PM   Radiology Dg Chest 2 View  Result Date: 10/15/2018 CLINICAL DATA:  Shortness of breath and productive cough for the past day. EXAM:  CHEST - 2 VIEW COMPARISON:  Chest x-ray dated October 05, 2018. FINDINGS: The heart size and mediastinal contours are within normal limits. Normal pulmonary vascularity. Atherosclerotic calcification of the aortic arch. No focal consolidation, pleural effusion, or pneumothorax. No acute osseous abnormality. IMPRESSION: No active cardiopulmonary disease. Electronically Signed   By: Titus Dubin M.D.   On: 10/15/2018 22:13    Procedures Procedures (including critical care time)  Medications Ordered in ED Medications  potassium chloride SA (K-DUR,KLOR-CON) CR tablet 20 mEq (has no administration in time range)  albuterol (PROVENTIL) (2.5 MG/3ML) 0.083% nebulizer solution 5 mg (5 mg Nebulization Given 10/15/18 2211)  predniSONE (DELTASONE) tablet 60 mg (60 mg Oral Given 10/15/18 2229)     Initial Impression / Assessment and Plan / ED Course  I have reviewed the triage vital signs and the nursing notes.  Pertinent labs & imaging results that were available during my care of the patient were reviewed by me and considered in my medical decision making (see chart for details).     Tamakia Porto is a 67 year old female with history of COPD, peripheral vascular disease status post bypass surgery several weeks ago who presents to the ED with shortness of breath, wheezing.  Patient with hypoxia upon arrival but on 2 L of oxygen which she is on intermittently at home.  Otherwise vitals unremarkable.  No fever.  Patient with symptoms for last several days.  Patient has had a cough with some sputum production.  No fever.  Has been using her albuterol at home with some relief.  Got duo nebs with EMS and feels improved.  Still has some increased work of breathing, wheezing, poor air movement on exam.  No signs of leg swelling on exam.  No history of PE.  Has good peripheral pulses.  No concern for PE or ACS.  Troponin within normal limits.  EKG shows sinus rhythm with no signs of ischemic changes.  Suspect  COPD exacerbation.  Will get chest x-ray to evaluate for pneumonia.  Will give duo nebs, prednisone and reevaluate. No sign of volume overload on exam.  Patient with no significant anemia, electrolyte abnormality, kidney injury.  No signs of pneumonia, pneumothorax, pleural effusion on chest x-ray.  Patient feels improved following breathing  treatment.  Still with shortness of breath with walking and after shared decision she would like admission for further treatments.  Patient admitted to hospitalist service for further COPD care. Stable on home O2.   This chart was dictated using voice recognition software.  Despite best efforts to proofread,  errors can occur which can change the documentation meaning.   Final Clinical Impressions(s) / ED Diagnoses   Final diagnoses:  COPD exacerbation [facility])    ED Discharge Orders    None       Lennice Sites, DO 10/16/18 0006

## 2018-10-15 NOTE — ED Notes (Signed)
Patient transported to X-ray 

## 2018-10-15 NOTE — ED Triage Notes (Addendum)
Pt arrives from home c/o SOB with cough, denies fever/chills.  EMS reports pt satting 90% RA, 96% on 2L, 100% 4L/  EMS reports giving 5 mg albuterol and 0.5 mg atrovent.  Pt denies CP. Pt reports using 2L O2 HS, reports recent intervention for blood clots in RLE.

## 2018-10-16 ENCOUNTER — Other Ambulatory Visit: Payer: Self-pay

## 2018-10-16 ENCOUNTER — Encounter (HOSPITAL_COMMUNITY): Payer: Self-pay | Admitting: Internal Medicine

## 2018-10-16 ENCOUNTER — Observation Stay (HOSPITAL_COMMUNITY): Payer: Medicare PPO

## 2018-10-16 DIAGNOSIS — C3490 Malignant neoplasm of unspecified part of unspecified bronchus or lung: Secondary | ICD-10-CM

## 2018-10-16 DIAGNOSIS — J9601 Acute respiratory failure with hypoxia: Secondary | ICD-10-CM

## 2018-10-16 DIAGNOSIS — J441 Chronic obstructive pulmonary disease with (acute) exacerbation: Secondary | ICD-10-CM

## 2018-10-16 DIAGNOSIS — I10 Essential (primary) hypertension: Secondary | ICD-10-CM | POA: Diagnosis not present

## 2018-10-16 DIAGNOSIS — C7931 Secondary malignant neoplasm of brain: Secondary | ICD-10-CM

## 2018-10-16 DIAGNOSIS — D649 Anemia, unspecified: Secondary | ICD-10-CM | POA: Diagnosis present

## 2018-10-16 LAB — CBC
HEMATOCRIT: 33.6 % — AB (ref 36.0–46.0)
Hemoglobin: 10.7 g/dL — ABNORMAL LOW (ref 12.0–15.0)
MCH: 28.8 pg (ref 26.0–34.0)
MCHC: 31.8 g/dL (ref 30.0–36.0)
MCV: 90.6 fL (ref 80.0–100.0)
Platelets: 334 10*3/uL (ref 150–400)
RBC: 3.71 MIL/uL — ABNORMAL LOW (ref 3.87–5.11)
RDW: 13.9 % (ref 11.5–15.5)
WBC: 7 10*3/uL (ref 4.0–10.5)
nRBC: 0 % (ref 0.0–0.2)

## 2018-10-16 LAB — HEPATIC FUNCTION PANEL
ALT: 20 U/L (ref 0–44)
AST: 22 U/L (ref 15–41)
Albumin: 3.6 g/dL (ref 3.5–5.0)
Alkaline Phosphatase: 64 U/L (ref 38–126)
Bilirubin, Direct: <0.1 mg/dL (ref 0.0–0.2)
Total Bilirubin: 0.6 mg/dL (ref 0.3–1.2)
Total Protein: 6.5 g/dL (ref 6.5–8.1)

## 2018-10-16 LAB — MAGNESIUM
Magnesium: 1.6 mg/dL — ABNORMAL LOW (ref 1.7–2.4)
Magnesium: 3 mg/dL — ABNORMAL HIGH (ref 1.7–2.4)

## 2018-10-16 LAB — BASIC METABOLIC PANEL
Anion gap: 13 (ref 5–15)
BUN: 8 mg/dL (ref 8–23)
CO2: 24 mmol/L (ref 22–32)
CREATININE: 0.96 mg/dL (ref 0.44–1.00)
Calcium: 9.3 mg/dL (ref 8.9–10.3)
Chloride: 98 mmol/L (ref 98–111)
GFR calc Af Amer: >60 mL/min (ref >60)
GFR calc non Af Amer: >60 mL/min (ref >60)
Glucose, Bld: 171 mg/dL — ABNORMAL HIGH (ref 70–99)
Potassium: 3.5 mmol/L (ref 3.5–5.1)
Sodium: 135 mmol/L (ref 135–145)

## 2018-10-16 LAB — IRON AND TIBC
Iron: 27 ug/dL — ABNORMAL LOW (ref 28–170)
Saturation Ratios: 8 % — ABNORMAL LOW (ref 10.4–31.8)
TIBC: 343 ug/dL (ref 250–450)
UIBC: 316 ug/dL

## 2018-10-16 LAB — BRAIN NATRIURETIC PEPTIDE: B NATRIURETIC PEPTIDE 5: 14 pg/mL (ref 0.0–100.0)

## 2018-10-16 LAB — TYPE AND SCREEN
ABO/RH(D): B POS
Antibody Screen: NEGATIVE

## 2018-10-16 LAB — ABO/RH: ABO/RH(D): B POS

## 2018-10-16 LAB — FERRITIN: FERRITIN: 127 ng/mL (ref 11–307)

## 2018-10-16 LAB — D-DIMER, QUANTITATIVE: D-Dimer, Quant: 1.97 ug/mL-FEU — ABNORMAL HIGH (ref 0.00–0.50)

## 2018-10-16 MED ORDER — BUPROPION HCL 100 MG PO TABS
100.0000 mg | ORAL_TABLET | Freq: Three times a day (TID) | ORAL | Status: DC
  Administered 2018-10-16 (×2): 100 mg via ORAL
  Filled 2018-10-16 (×3): qty 1

## 2018-10-16 MED ORDER — FLUTICASONE-UMECLIDIN-VILANT 100-62.5-25 MCG/INH IN AEPB: 1.0000 | INHALATION_SPRAY | Freq: Every day | RESPIRATORY_TRACT | Status: DC

## 2018-10-16 MED ORDER — UMECLIDINIUM BROMIDE 62.5 MCG/INH IN AEPB
1.0000 | INHALATION_SPRAY | Freq: Every day | RESPIRATORY_TRACT | Status: DC
  Filled 2018-10-16: qty 7

## 2018-10-16 MED ORDER — CLOPIDOGREL BISULFATE 75 MG PO TABS
75.0000 mg | ORAL_TABLET | Freq: Every day | ORAL | Status: DC
  Administered 2018-10-16: 75 mg via ORAL
  Filled 2018-10-16: qty 1

## 2018-10-16 MED ORDER — ASPIRIN 81 MG PO CHEW
81.0000 mg | CHEWABLE_TABLET | Freq: Every day | ORAL | Status: DC
  Administered 2018-10-16: 81 mg via ORAL
  Filled 2018-10-16: qty 1

## 2018-10-16 MED ORDER — ONDANSETRON HCL 4 MG PO TABS: 4.0000 mg | ORAL_TABLET | Freq: Four times a day (QID) | ORAL | Status: DC | PRN

## 2018-10-16 MED ORDER — FLUTICASONE FUROATE-VILANTEROL 100-25 MCG/INH IN AEPB
1.0000 | INHALATION_SPRAY | Freq: Every day | RESPIRATORY_TRACT | Status: DC
  Administered 2018-10-16: 1 via RESPIRATORY_TRACT
  Filled 2018-10-16: qty 28

## 2018-10-16 MED ORDER — AZITHROMYCIN 250 MG PO TABS
500.0000 mg | ORAL_TABLET | Freq: Every day | ORAL | Status: DC
  Administered 2018-10-16: 500 mg via ORAL
  Filled 2018-10-16: qty 2

## 2018-10-16 MED ORDER — POLYETHYLENE GLYCOL 3350 17 G PO PACK: 17.0000 g | PACK | Freq: Every day | ORAL | Status: DC | PRN

## 2018-10-16 MED ORDER — PREDNISONE 20 MG PO TABS
50.0000 mg | ORAL_TABLET | Freq: Every day | ORAL | Status: DC
  Administered 2018-10-16: 50 mg via ORAL
  Filled 2018-10-16: qty 1
  Filled 2018-10-16: qty 2

## 2018-10-16 MED ORDER — ENOXAPARIN SODIUM 40 MG/0.4ML ~~LOC~~ SOLN
40.0000 mg | SUBCUTANEOUS | Status: DC
  Administered 2018-10-16: 40 mg via SUBCUTANEOUS
  Filled 2018-10-16 (×2): qty 0.4

## 2018-10-16 MED ORDER — PREDNISONE 10 MG PO TABS: ORAL_TABLET | ORAL | 0 refills | Status: AC

## 2018-10-16 MED ORDER — ALBUTEROL SULFATE (2.5 MG/3ML) 0.083% IN NEBU: 2.5000 mg | INHALATION_SOLUTION | RESPIRATORY_TRACT | Status: DC | PRN

## 2018-10-16 MED ORDER — LORAZEPAM 1 MG PO TABS
1.0000 mg | ORAL_TABLET | Freq: Every day | ORAL | Status: DC
  Administered 2018-10-16: 1 mg via ORAL
  Filled 2018-10-16: qty 1

## 2018-10-16 MED ORDER — HYDROCHLOROTHIAZIDE 12.5 MG PO CAPS
12.5000 mg | ORAL_CAPSULE | Freq: Every day | ORAL | Status: DC
  Administered 2018-10-16: 12.5 mg via ORAL
  Filled 2018-10-16: qty 1

## 2018-10-16 MED ORDER — ACETAMINOPHEN 650 MG RE SUPP: 650.0000 mg | Freq: Four times a day (QID) | RECTAL | Status: DC | PRN

## 2018-10-16 MED ORDER — IOPAMIDOL (ISOVUE-370) INJECTION 76%
100.0000 mL | Freq: Once | INTRAVENOUS | Status: AC | PRN
  Administered 2018-10-16: 50 mL via INTRAVENOUS

## 2018-10-16 MED ORDER — ALBUTEROL SULFATE (2.5 MG/3ML) 0.083% IN NEBU
2.5000 mg | INHALATION_SOLUTION | RESPIRATORY_TRACT | Status: DC
  Administered 2018-10-16 (×2): 2.5 mg via RESPIRATORY_TRACT
  Filled 2018-10-16 (×2): qty 3

## 2018-10-16 MED ORDER — IOPAMIDOL (ISOVUE-370) INJECTION 76%
INTRAVENOUS | Status: AC
  Filled 2018-10-16: qty 100

## 2018-10-16 MED ORDER — ONDANSETRON HCL 4 MG/2ML IJ SOLN: 4.0000 mg | Freq: Four times a day (QID) | INTRAMUSCULAR | Status: DC | PRN

## 2018-10-16 MED ORDER — TRELEGY ELLIPTA 100-62.5-25 MCG/INH IN AEPB: 1.0000 | INHALATION_SPRAY | Freq: Every day | RESPIRATORY_TRACT | 1 refills | Status: AC

## 2018-10-16 MED ORDER — TRAMADOL HCL 50 MG PO TABS: 50.0000 mg | ORAL_TABLET | Freq: Four times a day (QID) | ORAL | Status: DC | PRN

## 2018-10-16 MED ORDER — PANTOPRAZOLE SODIUM 40 MG PO TBEC
40.0000 mg | DELAYED_RELEASE_TABLET | Freq: Every day | ORAL | Status: DC
  Administered 2018-10-16: 40 mg via ORAL
  Filled 2018-10-16: qty 1

## 2018-10-16 MED ORDER — ACETAMINOPHEN 325 MG PO TABS: 650.0000 mg | ORAL_TABLET | Freq: Four times a day (QID) | ORAL | Status: DC | PRN

## 2018-10-16 MED ORDER — MAGNESIUM SULFATE 2 GM/50ML IV SOLN
2.0000 g | Freq: Once | INTRAVENOUS | Status: AC
  Administered 2018-10-16: 2 g via INTRAVENOUS
  Filled 2018-10-16: qty 50

## 2018-10-16 MED ORDER — AZITHROMYCIN 250 MG PO TABS: 500.0000 mg | ORAL_TABLET | Freq: Every day | ORAL | 0 refills | Status: AC

## 2018-10-16 NOTE — Discharge Summary (Signed)
Physician Discharge Summary  Shelia Williams UMP:[id number] DOB: [birth year] DOA: 10/15/2018  PCP: System, Pcp Not In  Admit date: 10/15/2018 Discharge date: 10/16/2018  Admitted From: home Discharge disposition: home   Recommendations for Outpatient Follow-Up:   1. Continue home O2 2. Patient had run out of Triledgy and had not been using as prescribed   Discharge Diagnosis:   Principal Problem:   Acute respiratory failure with hypoxia ([place]) Active Problems:   Lung cancer metastatic to brain [facility])   PVD (peripheral vascular disease) ([place])   COPD with acute bronchitis (HCC)   COPD exacerbation (HCC)   Normocytic normochromic anemia   Essential hypertension    Discharge Condition: Improved.  Diet recommendation: Low sodium, heart healthy.  Wound care: None.  Code status: Full.   History of Present Illness:  Shelia Williams is a 67 y.o. 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  As per the patient and her daughter patient has been getting more short of breath last 2 days with some wheezing and exertional symptoms.  Denies any chest pain.  Has been having chronic productive cough.  Hospital Course by Problem:   Acute respiratory failure with hypoxia most likely from COPD -add abx -send refill on patient's Trilegy -CT scan of chest:  1. No pulmonary embolus.  2. Moderate emphysema. Central bronchial thickening, progressed from  prior exam. Scattered mucous plugging in the right lower lobe.  3. Tree in bud opacities in both lower lobes, slightly more  prominent on the left. This may be infectious, inflammatory, or  aspiration.  Hypertension -  hydrochlorothiazide.  Peripheral vascular disease status post recent thrombectomy last week for right lower extremity ischemic limb on Plavix and aspirin.  No acute issues at this time.  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  Normocytic normochromic anemia likely from recent surgery with blood loss.   -hgb improving slowly -Fe only minimally low  Tobacco abuse -encouraged cessation   Medical Consultants:      Discharge Exam:   Vitals:   10/16/18 1247 10/16/18 1526  BP: (!) 117/51 (!) 128/56  Pulse: 88 95  Resp: 18   Temp: 98.2 F (36.8 C) 98.3 F (36.8 C)  SpO2: 100% 96%   Vitals:   10/16/18 0601 10/16/18 0820 10/16/18 1247 10/16/18 1526  BP: 135/69  (!) 117/51 (!) 128/56  Pulse: 99  88 95  Resp: 20  18   Temp: 98 F (36.7 C)  98.2 F (36.8 C) 98.3 F (36.8 C)  TempSrc: Oral  Oral Oral  SpO2: 95% 97% 100% 96%  Weight:      Height:        General exam: Appears calm and comfortable.  breathing is much better today  The results of significant diagnostics from this hospitalization (including imaging, microbiology, ancillary and laboratory) are listed below for reference.     Procedures and Diagnostic Studies:   Dg Chest 2 View  Result Date: 10/15/2018 CLINICAL DATA:  Shortness of breath and productive cough for the past day. EXAM: CHEST - 2 VIEW COMPARISON:  Chest x-ray dated October 05, 2018. FINDINGS: The heart size and mediastinal contours are within normal limits. Normal pulmonary vascularity. Atherosclerotic  calcification of the aortic arch. No focal consolidation, pleural effusion, or pneumothorax. No acute osseous abnormality. IMPRESSION: No active cardiopulmonary disease. Electronically Signed   By: Titus Dubin M.D.   On: 10/15/2018 22:13   Ct Angio Chest Pe W Or Wo Contrast  Result Date: 10/16/2018 CLINICAL DATA:  Cough and shortness of breath. History of lung cancer. PE suspected, high pretest prob  EXAM: CT ANGIOGRAPHY CHEST WITH CONTRAST TECHNIQUE: Multidetector CT imaging of the chest was performed using the standard protocol during bolus administration of intravenous contrast. Multiplanar CT image reconstructions and MIPs were obtained to evaluate the vascular anatomy. CONTRAST:  26mL ISOVUE-370 IOPAMIDOL (ISOVUE-370) INJECTION 76% COMPARISON:  Radiographs yesterday. FINDINGS: Cardiovascular: There are no filling defects within the pulmonary arteries to suggest pulmonary embolus. The thoracic aorta is normal in caliber with moderate atherosclerosis. No dissection. Plaque at the great vessel origins without significant stenosis. The heart is normal in size. There are coronary artery calcifications. No pericardial effusion. Mediastinum/Nodes: Prominent right hilar node measuring 13 mm. No other enlarged lymph nodes. The esophagus is decompressed. Lungs/Pleura: Moderate emphysema. Central bronchial thickening, progressed from prior exam in the lower lobes. Scattered mucous plugging in the right lower lobe. Previous nodular opacity in the left upper lobe has resolved. The previous right middle lobe opacity has resolved with residual linear scarring. Tree in bud opacities in both lower lobes, slightly more prominent on the left. No pulmonary edema or pleural fluid. Trachea and central bronchi are patent. Upper Abdomen: No acute findings. Musculoskeletal: Degenerative change in the spine with mild disc calcification. There are no acute or suspicious osseous abnormalities. Review of the MIP images confirms the above findings. IMPRESSION: 1. No pulmonary embolus. 2. Moderate emphysema. Central bronchial thickening, progressed from prior exam. Scattered mucous plugging in the right lower lobe. 3. Tree in bud opacities in both lower lobes, slightly more prominent on the left. This may be infectious, inflammatory, or aspiration. 4. Prominent right hilar node is likely reactive. 5. Coronary artery calcifications.  Aortic Atherosclerosis (ICD10-I70.0) and Emphysema (ICD10-J43.9). Electronically Signed   By: Keith Rake M.D.   On: 10/16/2018 04:54     Labs:   Basic Metabolic Panel: Recent Labs  Lab 10/15/18 2235 10/15/18 2355 10/16/18 0309  NA 136  --  135  K 3.2*  --  3.5  CL 99  --  98  CO2 24  --  24  GLUCOSE 118*  --  171*  BUN 8  --  8  CREATININE 1.00  --  0.96  CALCIUM 9.1  --  9.3  MG  --  1.6* 3.0*   GFR Estimated Creatinine Clearance: 45 mL/min (by C-G formula based on SCr of 0.96 mg/dL). Liver Function Tests: Recent Labs  Lab 10/15/18 2355  AST 22  ALT 20  ALKPHOS 64  BILITOT 0.6  PROT 6.5  ALBUMIN 3.6   No results for input(s): LIPASE, AMYLASE in the last 168 hours. No results for input(s): AMMONIA in the last 168 hours. Coagulation profile No results for input(s): INR, PROTIME in the last 168 hours.  CBC: Recent Labs  Lab 10/15/18 2235 10/16/18 0309  WBC 7.6 7.0  NEUTROABS 5.0  --   HGB 10.2* 10.7*  HCT 32.8* 33.6*  MCV 92.4 90.6  PLT 329 334   Cardiac Enzymes: Recent Labs  Lab 10/15/18 2235  TROPONINI <0.03   BNP: Invalid input(s): POCBNP CBG: No results for input(s): GLUCAP in the last 168 hours. D-Dimer Recent Labs    10/15/18  2355  DDIMER 1.97*   Hgb A1c No results for input(s): HGBA1C in the last 72 hours. Lipid Profile No results for input(s): CHOL, HDL, LDLCALC, TRIG, CHOLHDL, LDLDIRECT in the last 72 hours. Thyroid function studies No results for input(s): TSH, T4TOTAL, T3FREE, THYROIDAB in the last 72 hours.  Invalid input(s): FREET3 Anemia work up Recent Labs    10/15/18 2355  FERRITIN 127  TIBC 343  IRON 27*   Microbiology No results found for this or any previous visit (from the past 240 hour(s)).   Discharge Instructions:   Discharge Instructions    Diet - low sodium heart healthy   Complete by:  As directed    Discharge instructions   Complete by:  As directed    Continue to use oxygen as needed Stop  smoking   Increase activity slowly   Complete by:  As directed      Allergies as of 10/16/2018      Reactions   Penicillins Itching   Has patient had a PCN reaction causing immediate rash, facial/tongue/throat swelling, SOB or lightheadedness with hypotension: No Has patient had a PCN reaction causing severe rash involving mucus membranes or skin necrosis: No Has patient had a PCN reaction that required hospitalization: No Has patient had a PCN reaction occurring within the last 10 years: No If all of the above answers are "NO", then may proceed with Cephalosporin use.      Medication List    STOP taking these medications   promethazine 12.5 MG tablet Commonly known as:  PHENERGAN     TAKE these medications   albuterol 108 (90 Base) MCG/ACT inhaler Commonly known as:  PROVENTIL HFA;VENTOLIN HFA Inhale 2 puffs into the lungs every 6 (six) hours as needed for wheezing.   albuterol (2.5 MG/3ML) 0.083% nebulizer solution Commonly known as:  PROVENTIL Inhale 2.5 mg into the lungs every 6 (six) hours as needed for wheezing.   aspirin 81 MG chewable tablet Chew 81 mg by mouth daily.   azithromycin 250 MG tablet Commonly known as:  ZITHROMAX Take 2 tablets (500 mg total) by mouth daily for 4 doses.   buPROPion 100 MG tablet Commonly known as:  WELLBUTRIN Take 300 mg by mouth daily.   clopidogrel 75 MG tablet Commonly known as:  PLAVIX Take 1 tablet (75 mg total) by mouth daily.   hydrochlorothiazide 12.5 MG capsule Commonly known as:  MICROZIDE Take 12.5 mg by mouth daily.   LORazepam 1 MG tablet Commonly known as:  ATIVAN Take 1 mg by mouth at bedtime.   pantoprazole 40 MG tablet Commonly known as:  PROTONIX Take 1 tablet (40 mg total) by mouth daily.   polyethylene glycol packet Commonly known as:  MIRALAX / GLYCOLAX Take 17 g by mouth daily as needed for mild constipation.   predniSONE 10 MG tablet Commonly known as:  DELTASONE 50 mg x2 days then 40mg  x 2  days  Then 30 mg x2 days then 20 mg x 2 days then 10 mg x 2 days   traMADol 50 MG tablet Commonly known as:  ULTRAM Take 50 mg by mouth every 6 (six) hours as needed for pain.   TRELEGY ELLIPTA 100-62.5-25 MCG/INH Aepb Generic drug:  Fluticasone-Umeclidin-Vilant Inhale 1 puff into the lungs daily.      Follow-up Information    [facility]. Go to.   Why:  for any urgent care needs. Contact information: [address] [place] [address]  [facility]. Schedule an appointment as soon as possible for a visit.   Why:  for a hospital follow-up Contact information: [address] [place]  [phone]           Time coordinating discharge: 25 min  Signed:  Geradine Girt DO  [facility] 10/16/2018, 5:28 PM

## 2018-10-16 NOTE — Care Management Note (Signed)
Case Management Note  Patient Details  Name: Shelia Williams MRN: [id number] Date of Birth: 11/21/1950  Subjective/Objective: 67 yo female presented with increased SOB.                   Action/Plan: CM met with patient to discuss transitional needs. Patient reports living in [place], [place], but has been living with her daughter since recent discharge on 12/10 when admitted for s/p thrombectomy RLE bypass with popliteal stent, with plans to continue until shortly after January 2020; independent with ADLs with no AD in use. PCP in [place]. CM offered to schedule an appointment for with a local PCP with patient declining, but agreeable to CM providing her with PCP/Urgent Care information in [facility]. Pharmacy of choice: [facility]. Patient daughter will provide transportation home. No further needs from CM.   Expected Discharge Date:                  Expected Discharge Plan:  Home/Self Care  In-House Referral:  NA  Discharge planning Services  CM Consult  Post Acute Care Choice:  NA Choice offered to:  NA  DME Arranged:  N/A DME Agency:  NA  HH Arranged:  NA HH Agency:  NA  Status of Service:  In process, will continue to follow  If discussed at Long Length of Stay Meetings, dates discussed:    Additional Comments:  Midge Minium RN, BSN, NCM-BC, ACM-RN [phone] 10/16/2018, 11:07 AM

## 2018-10-16 NOTE — H&P (Signed)
History and Physical    Shelia Williams WUJ:[id number] DOB: 23-Sep-1951 DOA: 10/15/2018  PCP: System, Pcp Not In  Patient coming from: Home.  Chief Complaint: Shortness of breath  HPI: Shelia Williams is a 67 y.o. 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  As per the patient and her daughter patient has been getting more short of breath last 2 days with some wheezing and exertional symptoms.  Denies any chest pain.  Has been having chronic productive cough.  ED Course: In the ER patient was easily becoming hypoxic on exertion.  Chest x-ray was unremarkable.  On exam patient has bilateral air entry but appears to be very tight.  Patient's hemoglobin also has decreased from baseline and is comparable to the one at discharge.  EKG shows normal sinus rhythm.  Patient admitted for acute respiratory failure with hypoxia likely from COPD I suspect patient's anemia may be contributing.  Review of Systems: As per HPI, rest all negative.   Past Medical History:  Diagnosis Date  . Cancer ([place])   . Hypertension   . Lung cancer [facility])     Past Surgical History:  Procedure Laterality Date  . ABDOMINAL HYSTERECTOMY    . arm surgery    . BRAIN SURGERY    . INSERTION OF ILIAC STENT Right 10/06/2018   Procedure: INSERTION OF STENT IN RIGHT FEMORAL POPLITEAL GRAFT, INSERTION OF STENT IN RIGHT POPLITEAL TO TIBIAL ARTERY;  Surgeon: Serafina Mitchell, MD;  Location: [facility];  Service: Vascular;  Laterality: Right;  . INTRAOPERATIVE ARTERIOGRAM  10/06/2018   Procedure: INTRA OPERATIVE ARTERIOGRAM WITH RIGHT LEG RUNOFF;  Surgeon: Serafina Mitchell, MD;  Location: [facility];  Service: Vascular;;  . PATCH ANGIOPLASTY Right 10/06/2018     Procedure: PATCH ANGIOPLASTY RIGHT FEMORAL ARTERY;  Surgeon: Serafina Mitchell, MD;  Location: [facility];  Service: Vascular;  Laterality: Right;  . THROMBECTOMY FEMORAL ARTERY Right 10/06/2018   Procedure: RIGHT FEMORAL ARTERY THROMBECTOMY;  Surgeon: Serafina Mitchell, MD;  Location: [facility];  Service: Vascular;  Laterality: Right;  . VEIN SURGERY       reports that she has been smoking cigarettes. She has never used smokeless tobacco. She reports previous alcohol use. She reports that she does not use drugs.  Allergies  Allergen Reactions  . Penicillins Itching    Has patient had a PCN reaction causing immediate rash, facial/tongue/throat swelling, SOB or lightheadedness with hypotension: No Has patient had a PCN reaction causing severe rash involving mucus membranes or skin necrosis: No Has patient had a PCN reaction that required hospitalization: No Has patient had a PCN reaction occurring within the last 10 years: No If all of the above answers are "NO", then may proceed with Cephalosporin use.     Family History  Problem Relation Age of Onset  . Diabetes Mellitus II Sister     Prior to Admission medications   Medication Sig Start Date End Date Taking? Authorizing Provider  albuterol (PROVENTIL HFA;VENTOLIN HFA) 108 (90 Base) MCG/ACT inhaler Inhale 2 puffs into the lungs every 6 (six) hours as needed for wheezing. 10/06/14  Yes [provider]  albuterol (PROVENTIL) (2.5 MG/3ML) 0.083% nebulizer solution Inhale 2.5 mg into the lungs every 6 (six) hours  as needed for wheezing. 01/06/16  Yes [provider]  aspirin 81 MG chewable tablet Chew 81 mg by mouth daily.   Yes [provider]  buPROPion (WELLBUTRIN) 100 MG tablet Take 300 mg by mouth daily. 09/20/18  Yes [provider]  clopidogrel (PLAVIX) 75 MG tablet Take 1 tablet (75 mg total) by mouth daily. 10/07/18  Yes Georgette Shell, MD  hydrochlorothiazide (MICROZIDE) 12.5 MG capsule Take 12.5 mg  by mouth daily. 09/20/18  Yes [provider]  LORazepam (ATIVAN) 1 MG tablet Take 1 mg by mouth at bedtime. 09/06/18  Yes [provider]  pantoprazole (PROTONIX) 40 MG tablet Take 1 tablet (40 mg total) by mouth daily. 10/08/18  Yes Georgette Shell, MD  polyethylene glycol [facility] Floria Raveling) packet Take 17 g by mouth daily as needed for mild constipation. 10/07/18  Yes Georgette Shell, MD  promethazine (PHENERGAN) 12.5 MG tablet Take 12.5 mg by mouth as needed for dizziness or nausea.   Yes [provider]  traMADol (ULTRAM) 50 MG tablet Take 50 mg by mouth every 6 (six) hours as needed for pain.   Yes [provider]  TRELEGY ELLIPTA 100-62.5-25 MCG/INH AEPB Inhale 1 puff into the lungs daily. 07/05/18  Yes [provider]    Physical Exam: Vitals:   10/16/18 0000 10/16/18 0015 10/16/18 0030 10/16/18 0105  BP: (!) 141/54  (!) 143/56 (!) 153/61  Pulse: 85 90 90 91  Resp: (!) 21 18 19    Temp:    98.4 F (36.9 C)  TempSrc:    Oral  SpO2: 96% 95% 95% 93%  Weight:    59.1 kg  Height:    5\' 2"  (1.575 m)      Constitutional: Moderately built and nourished. Vitals:   10/16/18 0000 10/16/18 0015 10/16/18 0030 10/16/18 0105  BP: (!) 141/54  (!) 143/56 (!) 153/61  Pulse: 85 90 90 91  Resp: (!) 21 18 19    Temp:    98.4 F (36.9 C)  TempSrc:    Oral  SpO2: 96% 95% 95% 93%  Weight:    59.1 kg  Height:    5\' 2"  (1.575 m)   Eyes: Anicteric no pallor. ENMT: No discharge from the ears eyes nose or mouth. Neck: No mass felt.  No JVD appreciated. Respiratory: Bilateral air entry present but appears tight.  No crepitations. Cardiovascular: S1-S2 heard. Abdomen: Soft nontender bowel sounds present. Musculoskeletal: No edema. Skin: No rash. Neurologic: Alert awake oriented to time place and person.  Moves all extremities. Psychiatric: Appears normal.  Normal affect.   Labs on Admission: I have personally reviewed following labs and  imaging studies  CBC: Recent Labs  Lab 10/15/18 2235  WBC 7.6  NEUTROABS 5.0  HGB 10.2*  HCT 32.8*  MCV 92.4  PLT 254   Basic Metabolic Panel: Recent Labs  Lab 10/15/18 2235 10/15/18 2355  NA 136  --   K 3.2*  --   CL 99  --   CO2 24  --   GLUCOSE 118*  --   BUN 8  --   CREATININE 1.00  --   CALCIUM 9.1  --   MG  --  1.6*   GFR: Estimated Creatinine Clearance: 43.2 mL/min (by C-G formula based on SCr of 1 mg/dL). Liver Function Tests: Recent Labs  Lab 10/15/18 2355  AST 22  ALT 20  ALKPHOS 64  BILITOT 0.6  PROT 6.5  ALBUMIN 3.6   No results  for input(s): LIPASE, AMYLASE in the last 168 hours. No results for input(s): AMMONIA in the last 168 hours. Coagulation Profile: No results for input(s): INR, PROTIME in the last 168 hours. Cardiac Enzymes: Recent Labs  Lab 10/15/18 2235  TROPONINI <0.03   BNP (last 3 results) No results for input(s): PROBNP in the last 8760 hours. HbA1C: No results for input(s): HGBA1C in the last 72 hours. CBG: No results for input(s): GLUCAP in the last 168 hours. Lipid Profile: No results for input(s): CHOL, HDL, LDLCALC, TRIG, CHOLHDL, LDLDIRECT in the last 72 hours. Thyroid Function Tests: No results for input(s): TSH, T4TOTAL, FREET4, T3FREE, THYROIDAB in the last 72 hours. Anemia Panel: Recent Labs    10/15/18 2355  FERRITIN 127  TIBC 343  IRON 27*   Urine analysis: No results found for: COLORURINE, APPEARANCEUR, LABSPEC, PHURINE, GLUCOSEU, HGBUR, BILIRUBINUR, KETONESUR, PROTEINUR, UROBILINOGEN, NITRITE, LEUKOCYTESUR Sepsis Labs: @LABRCNTIP (procalcitonin:4,lacticidven:4) )No results found for this or any previous visit (from the past 240 hour(s)).   Radiological Exams on Admission: Dg Chest 2 View  Result Date: 10/15/2018 CLINICAL DATA:  Shortness of breath and productive cough for the past day. EXAM: CHEST - 2 VIEW COMPARISON:  Chest x-ray dated October 05, 2018. FINDINGS: The heart size and mediastinal  contours are within normal limits. Normal pulmonary vascularity. Atherosclerotic calcification of the aortic arch. No focal consolidation, pleural effusion, or pneumothorax. No acute osseous abnormality. IMPRESSION: No active cardiopulmonary disease. Electronically Signed   By: Titus Dubin M.D.   On: 10/15/2018 22:13    EKG: Independently reviewed.  Normal sinus rhythm.  Assessment/Plan Principal Problem:   Acute respiratory failure with hypoxia ([facility]) Active Problems:   Lung cancer metastatic to brain [facility])   PVD (peripheral vascular disease) ([place])   COPD with acute bronchitis ([facility])   COPD exacerbation ([facility])   Normocytic normochromic anemia   Essential hypertension    1. Acute respiratory failure with hypoxia most likely from COPD for which I have continued patient's Trilegy and place patient on scheduled dose of albuterol nebulizer.  Since patient has acute symptoms with exertional symptoms over the last 2 days and recent hospitalization will check d-dimer and if positive will get CT angiogram of the chest to rule out pulmonary embolism.  Check troponin BNP. 2. Hypertension on hydrochlorothiazide. 3. Peripheral vascular disease status post recent thrombectomy last week for right lower extremity ischemic limb on Plavix and aspirin.  No acute issues at this time. 4. 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 5. Normocytic normochromic anemia likely from recent surgery with blood loss.  Follow CBC type and screen.  May be contributing to patient's symptoms.  Check iron level and ferritin levels.   DVT prophylaxis: Lovenox. Code Status: Full code. Family Communication: Discussed with patient. Disposition Plan: Home. Consults called: None. Admission status: Observation.   Rise Patience MD [facility] Pager [phone].  If 7PM-7AM, please contact night-coverage [email or web address] Password [email or web address]  10/16/2018, 1:17 AM

## 2018-10-16 NOTE — ED Notes (Signed)
Pt appears anxious,  especially with needle sticks.

## 2018-10-16 NOTE — Plan of Care (Signed)
  Problem: Education: Goal: Knowledge of General Education information will improve Description Including pain rating scale, medication(s)/side effects and non-pharmacologic comfort measures Outcome: Progressing Note:  POC and orders reviewed with pt.   

## 2018-10-16 NOTE — Progress Notes (Signed)
Pt. transported from ER via stretcher to 5C-08; alert and oriented x4, little anxious; daughter at bedside. Pt. oriented to call button and room.

## 2018-11-04 ENCOUNTER — Encounter: Payer: Self-pay | Admitting: Surgery

## 2018-11-04 ENCOUNTER — Ambulatory Visit (INDEPENDENT_AMBULATORY_CARE_PROVIDER_SITE_OTHER): Payer: Self-pay | Admitting: Surgery

## 2018-11-04 ENCOUNTER — Other Ambulatory Visit: Payer: Self-pay

## 2018-11-04 VITALS — BP 154/83 | HR 89 | Temp 98.1°F | Resp 20 | Ht 62.0 in | Wt 130.0 lb

## 2018-11-04 DIAGNOSIS — I739 Peripheral vascular disease, unspecified: Secondary | ICD-10-CM

## 2018-11-04 NOTE — Progress Notes (Signed)
Patient name: Shelia Williams MRN: [id number] DOB: Jun 19, 1951 Sex: female  REASON FOR VISIT:     post op  HISTORY OF PRESENT ILLNESS:   Shelia Williams is a 68 y.o. female who presented to the emergency department on 10/05/2018 with pain in her right leg that had been going on for several days.  She had numbness in her right great toe which was painful and cold.  She has a history of bilateral femoral-popliteal bypass graft done in the remote past.  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  She was taken the operating room on 10/06/2018.  I performed thrombectomy of her right femoral-popliteal bypass graft as well as a right common femoral profundofemoral endarterectomy with bovine patch angioplasty.  A bovine pericardial patch was placed on the distal common femoral artery extending onto the Gore-Tex bypass graft.  After thrombectomy of the bypass graft, angiography revealed that there was residual disease at the distal anastomosis I elected to treat this with a Viabahn stent.  There is also appeared to be residual plaque/dissection in the popliteal artery behind the knee therefore, I placed a  Tigris stent.  She had single-vessel runoff via the anterior tibial artery.  She did well from her operation and was discharged home on postoperative day 2 on Plavix.  CURRENT MEDICATIONS:    Current Outpatient Medications  Medication Sig Dispense Refill  . albuterol (PROVENTIL HFA;VENTOLIN HFA) 108 (90 Base) MCG/ACT inhaler Inhale 2 puffs into the lungs every 6 (six) hours as needed for wheezing.    Marland Kitchen albuterol (PROVENTIL) (2.5 MG/3ML) 0.083% nebulizer solution Inhale 2.5 mg into the lungs every 6 (six) hours as needed for wheezing.    Marland Kitchen aspirin 81 MG chewable tablet Chew 81 mg by mouth daily.    Marland Kitchen buPROPion (WELLBUTRIN) 100 MG tablet Take 300 mg by mouth daily.    . clopidogrel  (PLAVIX) 75 MG tablet Take 1 tablet (75 mg total) by mouth daily. 30 tablet 1  . hydrochlorothiazide (MICROZIDE) 12.5 MG capsule Take 12.5 mg by mouth daily.    Marland Kitchen LORazepam (ATIVAN) 1 MG tablet Take 1 mg by mouth at bedtime.  0  . pantoprazole (PROTONIX) 40 MG tablet Take 1 tablet (40 mg total) by mouth daily. 30 tablet 1  . polyethylene glycol (MIRALAX / GLYCOLAX) packet Take 17 g by mouth daily as needed for mild constipation. 14 each 0  . predniSONE (DELTASONE) 10 MG tablet 50 mg x2 days then 40mg  x 2 days  Then 30 mg x2 days then 20 mg x 2 days then 10 mg x 2 days 30 tablet 0  . traMADol (ULTRAM) 50 MG tablet Take 50 mg by mouth every 6 (six) hours as needed for pain.    . TRELEGY ELLIPTA 100-62.5-25 MCG/INH AEPB Inhale 1 puff into the lungs daily. 1 each 1   No current facility-administered medications for this visit.     REVIEW OF SYSTEMS:   [X]  denotes positive finding, [ ]  denotes negative finding Cardiac  Comments:  Chest pain or chest pressure:    Shortness of breath upon exertion:    Short of breath when lying flat:    Irregular heart rhythm:    Constitutional    Fever or chills:      PHYSICAL EXAM:   Vitals:   11/04/18 1025  BP: (!) 154/83  Pulse: 89  Resp: 20  Temp: 98.1 F (  36.7 C)  SpO2: 93%  Weight: 130 lb (59 kg)  Height: 5\' 2"  (1.575 m)    GENERAL: The patient is a well-nourished female, in no acute distress. The vital signs are documented above. CARDIOVASCULAR: There is a regular rate and rhythm. PULMONARY: Non-labored respirations Brisk bilateral anterior tibial Doppler signals The right groin is well-healed.  STUDIES:   None   MEDICAL ISSUES:   The patient is set to go home to [facility] this weekend.  I am going to fax her records to Dr. Silvestre Moment..  I told the patient that she will likely need intervention on her left leg in the near future to address the stenosis within the groin as well as in the iliac system.  [name], MD [facility] [phone] Pager [phone]

## 2020-06-13 IMAGING — MR MR HEAD WO/W CM
12 of 14 series · 40 of 48 positions shown · IV contrast (multihance)
Comparison: 11/11/2016 MR at [HOSPITAL]

CLINICAL DATA: Continued surveillance lung cancer. Radiation
therapy completed 3 months ago.

EXAM:
MRI HEAD WITHOUT AND WITH CONTRAST
TECHNIQUE: Multiplanar, multiecho pulse sequences of the brain and surrounding
structures were obtained without and with intravenous contrast.
CONTRAST:  10mL MULTIHANCE GADOBENATE DIMEGLUMINE 529 MG/ML IV SOLN

[Series 2: T1 · sagittal · 5.0mm · 0.45mm/px · 3 of 23 slices shown]
[im 1/23]
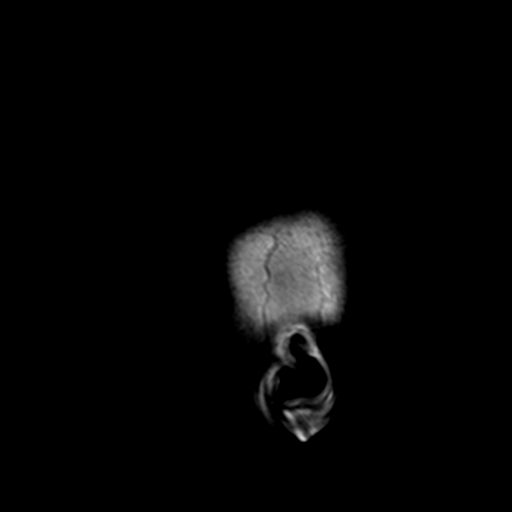
[im 12/23]
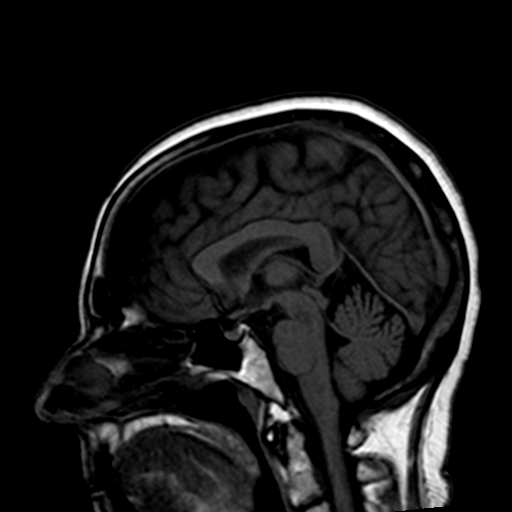
[im 23/23]
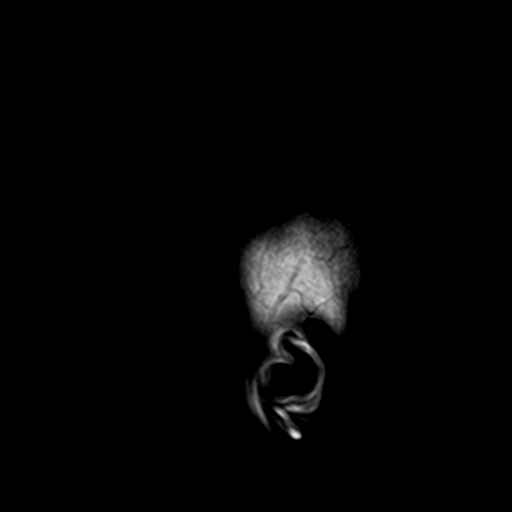

[Series 3: DWI · axial · 3.0mm · 2.19mm/px · z∈[-89,+60]mm · 8 of 94 slices shown (1 of 4)]
[im 1/94]
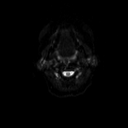
[im 14/94]
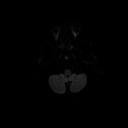
[im 27/94]
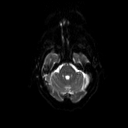
[im 40/94]
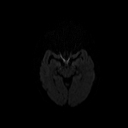
[im 54/94]
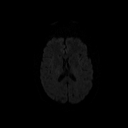
[im 67/94]
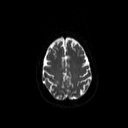
[im 80/94]
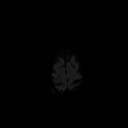
[im 94/94]
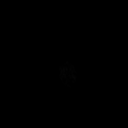

[Series 4: DWI · axial · 3.0mm · 2.19mm/px · z∈[-89,+60]mm · 4 of 47 slices shown (2 of 4)]
[im 1/47]
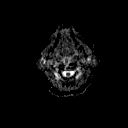
[im 16/47]
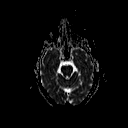
[im 31/47]
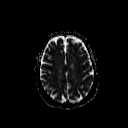
[im 47/47]
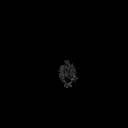

[Series 5: DWI · coronal · 3.0mm · 1.46mm/px · 8 of 90 slices shown (3 of 4)]
[im 1/90]
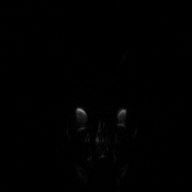
[im 13/90]
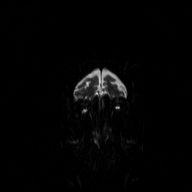
[im 26/90]
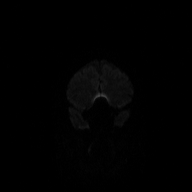
[im 39/90]
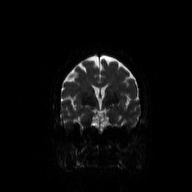
[im 51/90]
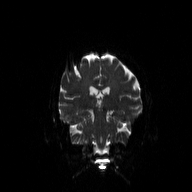
[im 64/90]
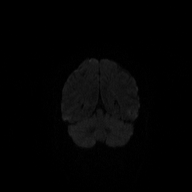
[im 77/90]
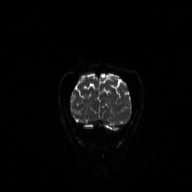
[im 90/90]
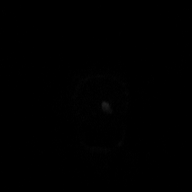

[Series 6: DWI · coronal · 3.0mm · 1.46mm/px · 4 of 45 slices shown (4 of 4)]
[im 1/45]
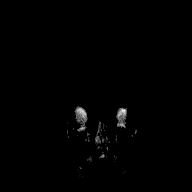
[im 15/45]
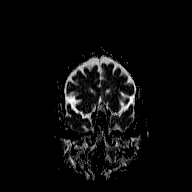
[im 30/45]
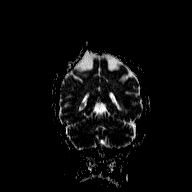
[im 45/45]
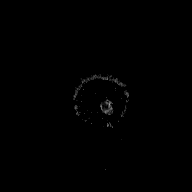

[Series 7: T2 · axial · 5.0mm · 0.45mm/px · z∈[-79,+81]mm · 2 of 24 slices shown (1 of 2)]
[im 1/24]
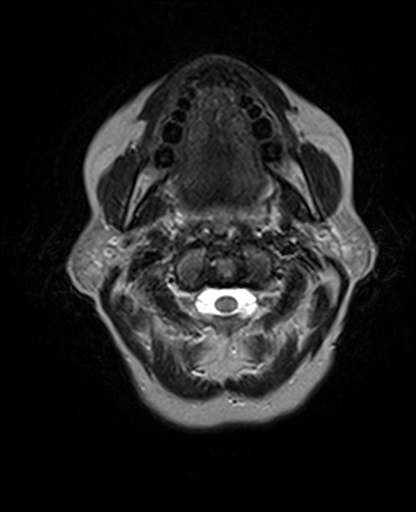
[im 24/24]
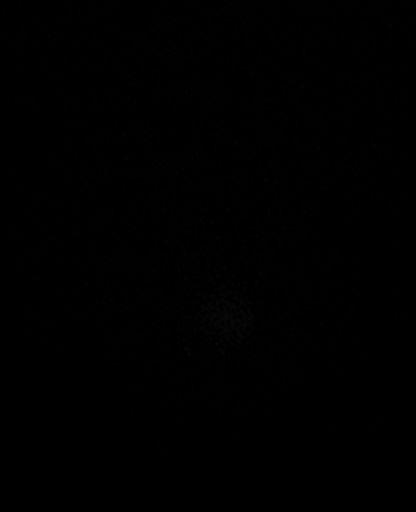

[Series 8: FLAIR · axial · 3.0mm · 0.45mm/px · z∈[-77,+78]mm · 2 of 27 slices shown]
[im 1/27]
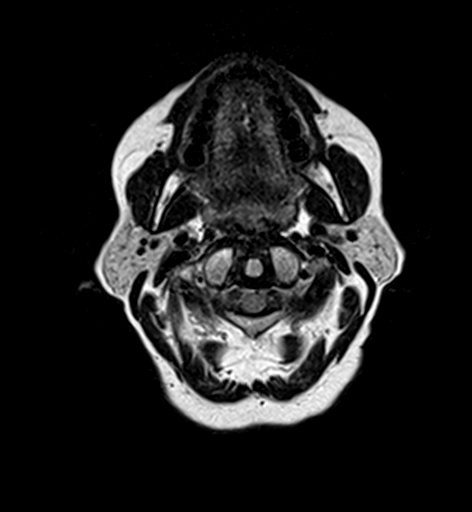
[im 27/27]
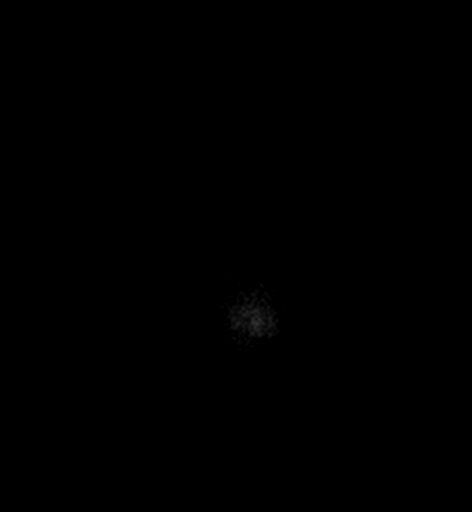

[Series 10: T2 · axial · 5.0mm · 0.45mm/px · z∈[-79,+81]mm · 2 of 24 slices shown (2 of 2)]
[im 1/24]
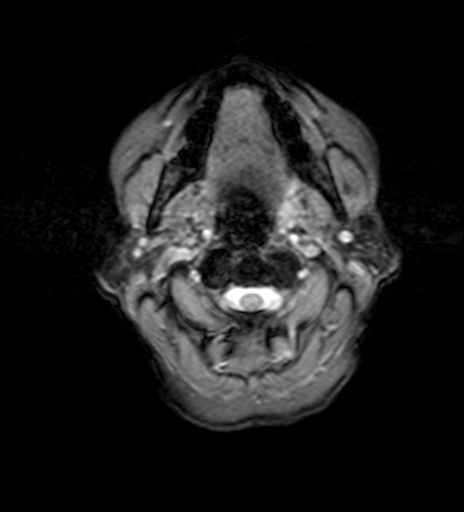
[im 24/24]
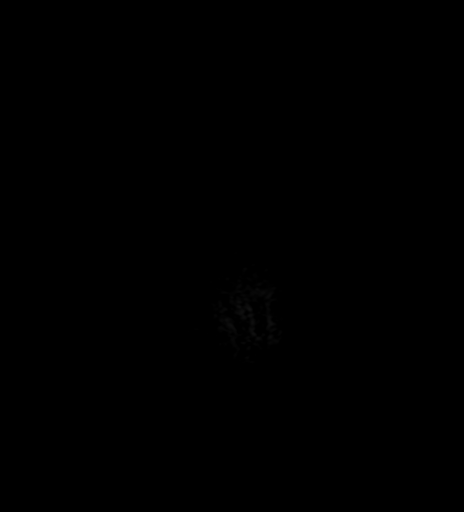

[Series 11: T2 post-contrast · coronal · 5.0mm · 0.45mm/px · 2 of 26 slices shown]
[im 1/26]
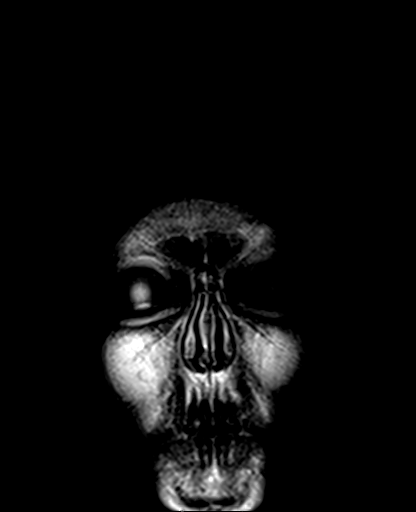
[im 26/26]
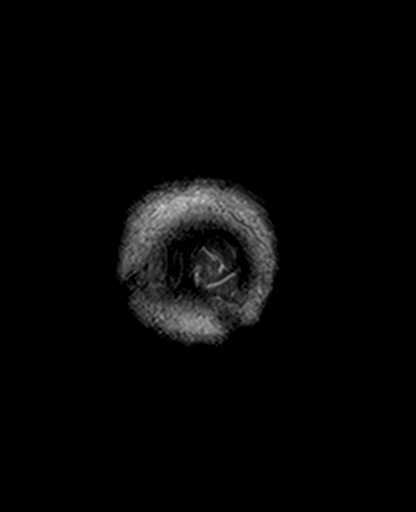

[Series 13: T1 post-contrast · coronal · 5.0mm · 0.45mm/px · 2 of 26 slices shown (1 of 2)]
[im 1/26]
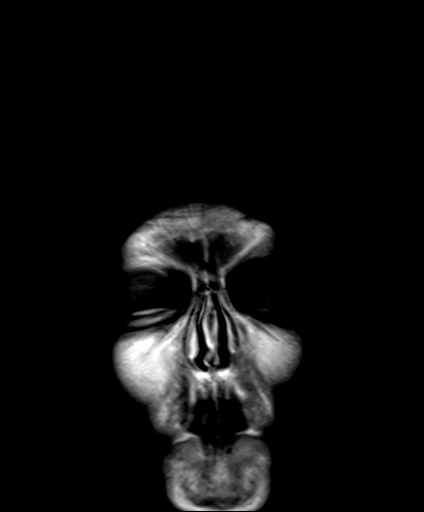
[im 26/26]
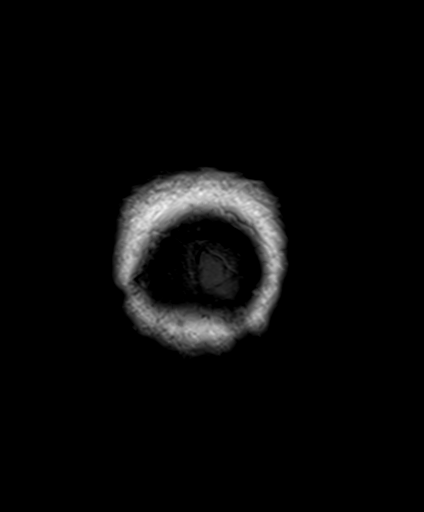

[Series 14: T1 post-contrast · sagittal · 5.0mm · 0.45mm/px · 2 of 23 slices shown (2 of 2)]
[im 1/23]
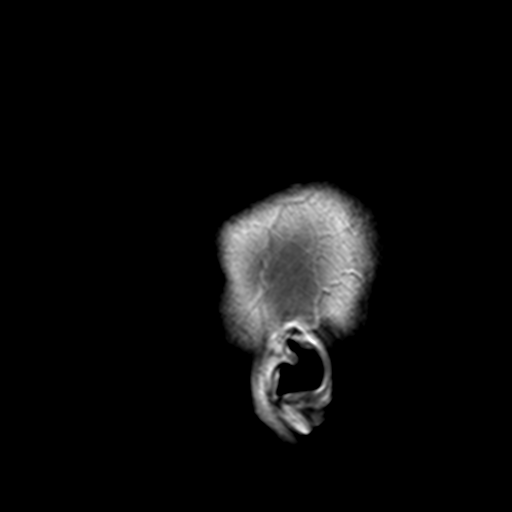
[im 23/23]
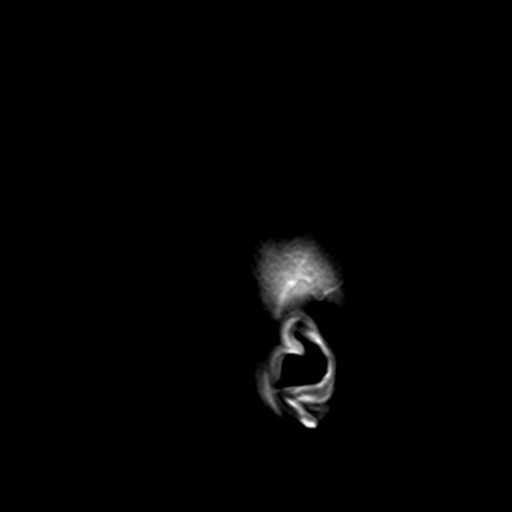

[Series 100: hx · axial · 10.0mm · 0.59mm/px · 1 of 9 slices shown]
[im 1/9]
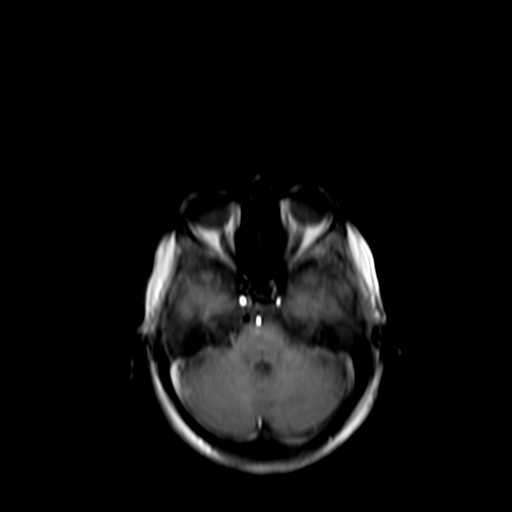

[40 of 48 positions shown; findings below may reference images not displayed]

FINDINGS: Brain: Interval growth of previously identified LEFT cerebellar
metastasis, previous 5 mm, now 13 by 10 x 6 mm.

New LEFT posterior temporal metastasis, not identified previously, 7
x 6 x 7 mm.

RIGHT posterior frontoparietal craniotomy. RIGHT posterior frontal
gyrus enhancing lesion, 10 x 6 x 10 mm, not present previously,
consistent with recurrent tumor. Two subcentimeter satellite lesions
in the adjacent RIGHT anterior parietal cortex, also not present
previously, all of concern for recurrent tumor. Post treatment
effect is not excluded, but less favored.

Mild vasogenic edema is associated with all lesions, most notably
LEFT cerebellum.

Small lacunar infarct, posterior lentiform nucleus.

No significant acute or chronic hemorrhage.

Vascular: Flow voids are maintained.

Skull and upper cervical spine: No worrisome osseous lesion.

Sinuses/Orbits: No layering sinus fluid.  Negative orbits.

Other: Unremarkable.
IMPRESSION: Interval growth of previously identified LEFT cerebellar metastasis,
with new LEFT posterior temporal metastasis, RIGHT posterior frontal
gyrus recurrence, and two small satellite lesions in the adjacent
anterior parietal cortex, all concerning for recurrent tumor.

At this time no midline shift or hydrocephalus. Close clinical
follow-up recommended.

These results will be called to the ordering clinician or
representative by the Radiologist Assistant, and communication
documented in the PACS or zVision Dashboard.

## 2022-04-29 DEATH — deceased
